# Patient Record
Sex: Female | Born: 1935 | Race: White | Hispanic: No | Marital: Married | Smoking: Never smoker
Health system: Southern US, Community
[De-identification: ages and names within clinical notes are randomized; demographics above are authoritative.]

## PROBLEM LIST (undated history)

## (undated) DIAGNOSIS — F419 Anxiety disorder, unspecified: Secondary | ICD-10-CM

## (undated) DIAGNOSIS — I10 Essential (primary) hypertension: Secondary | ICD-10-CM

## (undated) DIAGNOSIS — D649 Anemia, unspecified: Secondary | ICD-10-CM

## (undated) DIAGNOSIS — F32A Depression, unspecified: Secondary | ICD-10-CM

## (undated) DIAGNOSIS — E785 Hyperlipidemia, unspecified: Secondary | ICD-10-CM

## (undated) DIAGNOSIS — I00 Rheumatic fever without heart involvement: Secondary | ICD-10-CM

## (undated) DIAGNOSIS — E039 Hypothyroidism, unspecified: Secondary | ICD-10-CM

## (undated) DIAGNOSIS — F329 Major depressive disorder, single episode, unspecified: Secondary | ICD-10-CM

## (undated) DIAGNOSIS — R7302 Impaired glucose tolerance (oral): Secondary | ICD-10-CM

## (undated) DIAGNOSIS — K219 Gastro-esophageal reflux disease without esophagitis: Secondary | ICD-10-CM

## (undated) DIAGNOSIS — Z8619 Personal history of other infectious and parasitic diseases: Secondary | ICD-10-CM

## (undated) DIAGNOSIS — K5792 Diverticulitis of intestine, part unspecified, without perforation or abscess without bleeding: Secondary | ICD-10-CM

## (undated) HISTORY — DX: Impaired glucose tolerance (oral): R73.02

## (undated) HISTORY — DX: Rheumatic fever without heart involvement: I00

## (undated) HISTORY — PX: ABDOMINAL HYSTERECTOMY: SHX81

## (undated) HISTORY — DX: Anxiety disorder, unspecified: F41.9

## (undated) HISTORY — DX: Hypothyroidism, unspecified: E03.9

## (undated) HISTORY — DX: Hyperlipidemia, unspecified: E78.5

## (undated) HISTORY — DX: Essential (primary) hypertension: I10

## (undated) HISTORY — DX: Anemia, unspecified: D64.9

## (undated) HISTORY — DX: Gastro-esophageal reflux disease without esophagitis: K21.9

## (undated) HISTORY — DX: Personal history of other infectious and parasitic diseases: Z86.19

## (undated) HISTORY — DX: Diverticulitis of intestine, part unspecified, without perforation or abscess without bleeding: K57.92

---

## 1989-05-27 HISTORY — PX: ABDOMINAL HYSTERECTOMY: SUR658

## 1997-11-07 ENCOUNTER — Other Ambulatory Visit: Admission: RE | Admit: 1997-11-07 | Discharge: 1997-11-07 | Payer: Self-pay | Admitting: Family Medicine

## 1997-11-10 ENCOUNTER — Other Ambulatory Visit: Admission: RE | Admit: 1997-11-10 | Discharge: 1997-11-10 | Payer: Self-pay | Admitting: Family Medicine

## 1998-12-15 ENCOUNTER — Other Ambulatory Visit: Admission: RE | Admit: 1998-12-15 | Discharge: 1998-12-15 | Payer: Self-pay | Admitting: Family Medicine

## 1999-07-19 LAB — CONVERTED CEMR LAB: Pap Smear: NORMAL

## 1999-12-24 ENCOUNTER — Other Ambulatory Visit: Admission: RE | Admit: 1999-12-24 | Discharge: 1999-12-24 | Payer: Self-pay | Admitting: Family Medicine

## 2001-04-08 ENCOUNTER — Encounter (INDEPENDENT_AMBULATORY_CARE_PROVIDER_SITE_OTHER): Payer: Self-pay | Admitting: *Deleted

## 2003-02-02 ENCOUNTER — Encounter (INDEPENDENT_AMBULATORY_CARE_PROVIDER_SITE_OTHER): Payer: Self-pay | Admitting: *Deleted

## 2007-02-27 ENCOUNTER — Encounter (INDEPENDENT_AMBULATORY_CARE_PROVIDER_SITE_OTHER): Payer: Self-pay | Admitting: *Deleted

## 2007-02-28 ENCOUNTER — Encounter (INDEPENDENT_AMBULATORY_CARE_PROVIDER_SITE_OTHER): Payer: Self-pay | Admitting: *Deleted

## 2008-02-24 ENCOUNTER — Encounter (INDEPENDENT_AMBULATORY_CARE_PROVIDER_SITE_OTHER): Payer: Self-pay | Admitting: *Deleted

## 2008-03-02 ENCOUNTER — Encounter (INDEPENDENT_AMBULATORY_CARE_PROVIDER_SITE_OTHER): Payer: Self-pay | Admitting: *Deleted

## 2008-07-20 ENCOUNTER — Ambulatory Visit: Payer: Self-pay | Admitting: *Deleted

## 2008-07-20 DIAGNOSIS — K5732 Diverticulitis of large intestine without perforation or abscess without bleeding: Secondary | ICD-10-CM | POA: Insufficient documentation

## 2008-07-20 DIAGNOSIS — E785 Hyperlipidemia, unspecified: Secondary | ICD-10-CM | POA: Insufficient documentation

## 2008-07-20 DIAGNOSIS — K219 Gastro-esophageal reflux disease without esophagitis: Secondary | ICD-10-CM | POA: Insufficient documentation

## 2008-07-20 DIAGNOSIS — F329 Major depressive disorder, single episode, unspecified: Secondary | ICD-10-CM | POA: Insufficient documentation

## 2008-07-20 DIAGNOSIS — E039 Hypothyroidism, unspecified: Secondary | ICD-10-CM | POA: Insufficient documentation

## 2008-07-20 DIAGNOSIS — B019 Varicella without complication: Secondary | ICD-10-CM | POA: Insufficient documentation

## 2008-07-20 DIAGNOSIS — I00 Rheumatic fever without heart involvement: Secondary | ICD-10-CM | POA: Insufficient documentation

## 2008-07-20 DIAGNOSIS — M810 Age-related osteoporosis without current pathological fracture: Secondary | ICD-10-CM | POA: Insufficient documentation

## 2008-07-20 DIAGNOSIS — F411 Generalized anxiety disorder: Secondary | ICD-10-CM | POA: Insufficient documentation

## 2008-07-24 DIAGNOSIS — I10 Essential (primary) hypertension: Secondary | ICD-10-CM | POA: Insufficient documentation

## 2008-07-28 DIAGNOSIS — E871 Hypo-osmolality and hyponatremia: Secondary | ICD-10-CM | POA: Insufficient documentation

## 2008-07-28 DIAGNOSIS — E739 Lactose intolerance, unspecified: Secondary | ICD-10-CM | POA: Insufficient documentation

## 2008-07-28 DIAGNOSIS — D509 Iron deficiency anemia, unspecified: Secondary | ICD-10-CM | POA: Insufficient documentation

## 2008-07-28 DIAGNOSIS — R748 Abnormal levels of other serum enzymes: Secondary | ICD-10-CM | POA: Insufficient documentation

## 2008-08-17 ENCOUNTER — Ambulatory Visit: Payer: Self-pay | Admitting: *Deleted

## 2008-11-29 ENCOUNTER — Ambulatory Visit: Payer: Self-pay | Admitting: Family Medicine

## 2008-11-30 ENCOUNTER — Encounter: Payer: Self-pay | Admitting: Family Medicine

## 2008-12-01 LAB — CONVERTED CEMR LAB
AST: 25 units/L (ref 0–37)
BUN: 26 mg/dL — ABNORMAL HIGH (ref 6–23)
CO2: 18 meq/L — ABNORMAL LOW (ref 19–32)
Calcium: 9.5 mg/dL (ref 8.4–10.5)
Chloride: 104 meq/L (ref 96–112)
Cholesterol: 206 mg/dL — ABNORMAL HIGH (ref 0–200)
Creatinine, Ser: 0.92 mg/dL (ref 0.40–1.20)
HCT: 44.1 % (ref 36.0–46.0)
HDL: 51 mg/dL (ref >39)
Hemoglobin: 14.2 g/dL (ref 12.0–15.0)
MCV: 96.7 fL (ref 78.0–100.0)
RDW: 15.4 % (ref 11.5–15.5)
Total CHOL/HDL Ratio: 4

## 2008-12-13 ENCOUNTER — Telehealth: Payer: Self-pay | Admitting: Family Medicine

## 2008-12-13 DIAGNOSIS — R7301 Impaired fasting glucose: Secondary | ICD-10-CM

## 2009-03-01 ENCOUNTER — Encounter: Payer: Self-pay | Admitting: Internal Medicine

## 2009-03-01 LAB — HM MAMMOGRAPHY: HM Mammogram: NORMAL

## 2009-03-08 ENCOUNTER — Telehealth: Payer: Self-pay | Admitting: Internal Medicine

## 2009-03-13 ENCOUNTER — Encounter: Payer: Self-pay | Admitting: Internal Medicine

## 2009-04-03 ENCOUNTER — Telehealth (INDEPENDENT_AMBULATORY_CARE_PROVIDER_SITE_OTHER): Payer: Self-pay | Admitting: *Deleted

## 2009-04-05 ENCOUNTER — Telehealth (INDEPENDENT_AMBULATORY_CARE_PROVIDER_SITE_OTHER): Payer: Self-pay | Admitting: *Deleted

## 2009-06-13 ENCOUNTER — Ambulatory Visit: Payer: Self-pay | Admitting: Family

## 2009-06-13 LAB — CONVERTED CEMR LAB
ALT: 18 units/L (ref 0–35)
Alkaline Phosphatase: 111 units/L (ref 39–117)
BUN: 17 mg/dL (ref 6–23)
Basophils Absolute: 0 10*3/uL (ref 0.0–0.1)
Bilirubin, Direct: 0.1 mg/dL (ref 0.0–0.3)
Creatinine, Ser: 0.9 mg/dL (ref 0.4–1.2)
GFR calc non Af Amer: 62.73 mL/min (ref >60)
Lymphocytes Relative: 20.6 % (ref 12.0–46.0)
Lymphs Abs: 1.4 10*3/uL (ref 0.7–4.0)
Monocytes Relative: 9 % (ref 3.0–12.0)
Platelets: 337 10*3/uL (ref 150.0–400.0)
RDW: 13.4 % (ref 11.5–14.6)
TSH: 2.48 microintl units/mL (ref 0.35–5.50)
Total Protein: 7.8 g/dL (ref 6.0–8.3)

## 2009-06-15 ENCOUNTER — Encounter: Payer: Self-pay | Admitting: Family

## 2009-06-15 ENCOUNTER — Telehealth: Payer: Self-pay | Admitting: Family

## 2009-06-15 DIAGNOSIS — E875 Hyperkalemia: Secondary | ICD-10-CM

## 2009-07-05 ENCOUNTER — Ambulatory Visit: Payer: Self-pay | Admitting: Family

## 2009-07-10 LAB — CONVERTED CEMR LAB
CO2: 25 meq/L (ref 19–32)
Calcium: 9.2 mg/dL (ref 8.4–10.5)
Chloride: 108 meq/L (ref 96–112)
Sodium: 139 meq/L (ref 135–145)

## 2009-07-19 ENCOUNTER — Ambulatory Visit: Payer: Self-pay | Admitting: Family

## 2009-07-19 LAB — CONVERTED CEMR LAB
HDL: 50.4 mg/dL (ref >39.00)
Total CHOL/HDL Ratio: 4
Triglycerides: 155 mg/dL — ABNORMAL HIGH (ref 0.0–149.0)

## 2009-09-25 ENCOUNTER — Telehealth (INDEPENDENT_AMBULATORY_CARE_PROVIDER_SITE_OTHER): Payer: Self-pay | Admitting: *Deleted

## 2009-10-18 ENCOUNTER — Ambulatory Visit: Payer: Self-pay | Admitting: Family Medicine

## 2010-01-24 ENCOUNTER — Ambulatory Visit: Payer: Self-pay | Admitting: Family Medicine

## 2010-01-25 LAB — CONVERTED CEMR LAB
ALT: 16 units/L (ref 0–35)
Alkaline Phosphatase: 91 units/L (ref 39–117)
BUN: 19 mg/dL (ref 6–23)
Basophils Absolute: 0 10*3/uL (ref 0.0–0.1)
Bilirubin, Direct: 0.2 mg/dL (ref 0.0–0.3)
CO2: 27 meq/L (ref 19–32)
Chloride: 105 meq/L (ref 96–112)
Eosinophils Absolute: 0.1 10*3/uL (ref 0.0–0.7)
GFR calc non Af Amer: 78.52 mL/min (ref >60)
Glucose, Bld: 88 mg/dL (ref 70–99)
HCT: 40.3 % (ref 36.0–46.0)
Lymphs Abs: 1.4 10*3/uL (ref 0.7–4.0)
MCHC: 34.1 g/dL (ref 30.0–36.0)
MCV: 94.6 fL (ref 78.0–100.0)
Monocytes Absolute: 0.5 10*3/uL (ref 0.1–1.0)
Platelets: 283 10*3/uL (ref 150.0–400.0)
Potassium: 4.4 meq/L (ref 3.5–5.1)
RDW: 14.8 % — ABNORMAL HIGH (ref 11.5–14.6)
TSH: 2.9 microintl units/mL (ref 0.35–5.50)
Total Bilirubin: 1.1 mg/dL (ref 0.3–1.2)
Total Protein: 7 g/dL (ref 6.0–8.3)

## 2010-04-26 ENCOUNTER — Encounter: Payer: Self-pay | Admitting: Family Medicine

## 2010-04-26 ENCOUNTER — Telehealth (INDEPENDENT_AMBULATORY_CARE_PROVIDER_SITE_OTHER): Payer: Self-pay | Admitting: *Deleted

## 2010-05-29 ENCOUNTER — Encounter: Payer: Self-pay | Admitting: Family Medicine

## 2010-06-03 ENCOUNTER — Encounter: Payer: Self-pay | Admitting: Family Medicine

## 2010-06-26 NOTE — Assessment & Plan Note (Signed)
Summary: NEW TO ESTABLISH /OLD DR. Andrey Campanile   Vital Signs:  Patient profile:   77 year old female Height:      63 inches Weight:      110.6 pounds Pulse rate:   64 / minute BP sitting:   100 / 60  (left arm)  Vitals Entered By: Doristine Devoid (June 13, 2009 9:11 AM) CC: new est- refill on meds  Comments    Primary Care Provider:  Seymour Bars DO  CC:  new est- refill on meds .  History of Present Illness: Veronica Mckenzie is an 77 year old female who presents today for follow up.  She was seen last visit for follow up of her hyperglycemia.  Her A1C in May was 5.7.   Her TSH was also normal at that time.  Today her BP is 100/60 on amlodipine 10mg .  Patient agreeable to Td-last one greater than 10 years ago.  Allergies: 1)  ! 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   4 children Never Smoked Alcohol use-no  Risk Factors: Caffeine Use: 1 (07/20/2008) Exercise: no (07/20/2008)  Risk Factors: Smoking Status: never (07/20/2008) Passive Smoke Exposure: no (07/20/2008)  Family History: Reviewed history from 07/20/2008 and no changes required. Family History of Arthritis Family History Hypertension Family History of  CAD  Social History: Reviewed history from 11/29/2008 and no changes required. 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   4 children Never Smoked Alcohol  use-no  Review of Systems       Notes nocturia x 1 at night.   Denies polydypsia.  Daughter notes overall not a good appetite.    Physical Exam  General:  Thin elderly female, in no acute distress; alert,appropriate and cooperative throughout examination Head:  Normocephalic and atraumatic without obvious abnormalities. No apparent alopecia or balding. Eyes:  No injection Ears:  External ear exam shows no significant lesions or deformities.  Otoscopic examination reveals clear canals, tympanic membranes are intact bilaterally without bulging, retraction, inflammation or discharge. Hearing is grossly normal bilaterally. Mouth:  Oral mucosa and oropharynx without lesions or exudates.  Teeth in good repair. Neck:  No deformities, masses, or tenderness noted. Lungs:  Normal respiratory effort, chest expands symmetrically. Lungs are clear to auscultation, no crackles or wheezes. Heart:  Normal rate and regular rhythm. S1 and S2 normal without gallop, murmur, click, rub or other extra sounds. Abdomen:  Bowel sounds positive,abdomen soft and non-tender without masses, organomegaly or hernias noted. Msk:  MAE Extremities:  trace left pedal edema and trace right pedal edema.   Neurologic:  strength normal in all extremities.   Skin:  Intact without suspicious lesions or rashes Cervical Nodes:  No lymphadenopathy noted Psych:  Cognition and judgment appear intact. Alert and cooperative with normal attention span and concentration. No apparent delusions, illusions, hallucinations   Impression & Recommendations:  Problem # 1:  IMPAIRED FASTING GLUCOSE (ICD-790.21) Assessment New Will check A1C today, was stable last visit.  Patient's daughter continues to help her with her meal choices. Orders:  Venipuncture ([address]) TLB-BMP (Basic Metabolic Panel-BMET) (80048-METABOL)  Problem # 2:  ANXIETY (ICD-300.00) Assessment: Improved Patient reports that this is well controlled on paroxetine, will  continue the same. Her updated medication list for this problem includes:    Paroxetine Hcl 10 Mg Tabs (Paroxetine hcl) ..... One tab by mouth once daily  Problem # 3:  OSTEOPOROSIS (ICD-733.00) Assessment: Comment Only declines dexascan, plan to continue boniva, will also add Caltrate +D to her regimen.  Her updated medication list for this problem includes:    Boniva 150 Mg Tabs (Ibandronate sodium) .Marland Kitchen... Take one tablet once monthly with 8 to 10 ounces of water. stay upright and do not eat or drink for 1 hours.  Problem # 4:  ANEMIA, IRON DEFICIENCY (ICD-280.9) Assessment: Comment Only Will check CBC to monitor. Orders: TLB-CBC Platelet - w/Differential (85025-CBCD)  Problem # 5:  Preventive Health Care (ICD-V70.0) Declines dexa scan, s/p TAH,  agreeable to bimanual exam next visit. (still has ovaries)  Last mammogram 10/10- normal, had flu shot  Problem # 6:  HYPERTENSION (ICD-401.9) Assessment: Comment Only Appears overtreated- will decrease amlodipine to 5 mg.  Plan follow up BP check n 1 month. Her updated medication list for this problem includes:    Norvasc 5 Mg Tabs (Amlodipine besylate) ..... One tablet by mouth daily  BP today: 100/60 Prior BP: 153/77 (11/29/2008)  Labs Reviewed: K+: 4.6 (11/30/2008) Creat: : 0.92 (11/30/2008)   Chol: 206 (11/30/2008)   HDL: 51 (11/30/2008)   LDL: 106 (11/30/2008)   TG: 243 (11/30/2008)  Problem # 7:  HYPERLIPIDEMIA (ICD-272.4) Assessment: Comment Only Plan to have patient perform FLP when fasting  Complete Medication List: 1)  Boniva 150 Mg Tabs (Ibandronate sodium) .... Take one tablet once monthly with 8 to 10 ounces of water. stay upright and do not eat or drink for 1 hours. 2)  Norvasc 5 Mg Tabs (Amlodipine besylate) .... One tablet by mouth daily 3)  Synthroid 50 Mcg Tabs (Levothyroxine sodium) .... Take 1 tablet by mouth once a day 4)  Paroxetine Hcl 10 Mg Tabs (Paroxetine hcl) .... One tab by mouth once daily 5)  Caltrate  600+d 600-400 Mg-unit Tabs (Calcium carbonate-vitamin d) .... One tablet by mouth bid  Other Orders: TLB-TSH (Thyroid Stimulating Hormone) (84443-TSH) TLB-Hepatic/Liver Function Pnl (80076-HEPATIC) TD Toxoids IM 7 YR + ([address]) Admin 1st Vaccine (098[address])  Patient Instructions: 1)  Please return fasting for a visit in 1 month. 2)  Complete your lab work prior to leaving. Prescriptions: PAROXETINE HCL 10 MG TABS (PAROXETINE HCL) one tab by mouth once daily  #30 x 2   Entered and Authorized by:   Lemont Fillers FNP   Signed by:   Lemont Fillers FNP on 06/13/2009   Method used:   Electronically to        [facility] [phone]* (retail)       [address]       [place], [place]  [address]       Ph: [phone]       Fax: [phone]   RxID:   [id number] SYNTHROID 50 MCG TABS (LEVOTHYROXINE SODIUM) Take 1 tablet by mouth once a day  #30 x 2   Entered and Authorized by:   Lemont Fillers FNP   Signed by:   Lemont Fillers FNP on 06/13/2009   Method used:   Electronically to        [facility] [phone]* (retail)  [address]       [place], [place]  [address]       Ph: [phone]       Fax: [phone]   RxID:   [id number] BONIVA 150 MG TABS (IBANDRONATE SODIUM) take one tablet once monthly with 8 to 10 ounces of water. Stay upright and do not eat or drink for 1 hours.  #1 x 2   Entered and Authorized by:   Lemont Fillers FNP   Signed by:   Lemont Fillers FNP on 06/13/2009   Method used:   Electronically to        [facility] [phone]* (retail)       [address]       [place], [place]  [address]       Ph: [phone]       Fax: [phone]   RxID:   [id number] NORVASC 5 MG TABS (AMLODIPINE BESYLATE) one tablet by mouth daily  #30 x 1   Entered and Authorized by:   Lemont Fillers FNP   Signed by:   Lemont Fillers FNP on 06/13/2009   Method used:   Electronically to        [facility] [phone]*  (retail)       [address]       [place], [place]  [address]       Ph: [phone]       Fax: [phone]   RxID:   [id number]    Preventive Care Screening     -has been over 10 yrs since last colon but daughter not sure she can repeat.    Immunization History:  Influenza Immunization History:    Influenza:  historical (02/24/2009)  Immunizations Administered:  Tetanus Vaccine:    Vaccine Type: Td    Site: left deltoid    Mfr: Sanofi Pasteur    Dose: 0.5 ml    Route: IM    Given by: Doristine Devoid    Exp. Date: 07/29/2010    Lot #: [id number]   Prevention & Chronic Care Immunizations   Influenza vaccine: Historical  (02/24/2009)    Tetanus booster: 06/13/2009: Td    Pneumococcal vaccine: Not documented    H. zoster vaccine: Not documented  Colorectal Screening   Hemoccult: Not documented    Colonoscopy: Not documented  Other Screening   Pap smear: Normal  (07/19/1999)    Mammogram: normal  (03/01/2009)    DXA bone density scan: Not documented   Smoking status: never  (07/20/2008)  Lipids   Total Cholesterol: 206  (11/30/2008)   LDL: 106  (11/30/2008)   LDL Direct: Not documented   HDL: 51  (11/30/2008)   Triglycerides: 243  (11/30/2008)    SGOT (AST): 25  (11/30/2008)   SGPT (ALT): 18  (11/30/2008)   Alkaline phosphatase: 101  (11/30/2008)   Total bilirubin: 0.7  (11/30/2008)  Hypertension   Last Blood Pressure: 100 / 60  (06/13/2009)   Serum creatinine: 0.92  (11/30/2008)   Serum potassium 4.6  (11/30/2008)  Self-Management Support :    Hypertension self-management support: Not documented    Lipid self-management support: Not documented

## 2010-06-26 NOTE — Progress Notes (Signed)
Summary: Lab Results  Phone Note Outgoing Call   Summary of Call: Would you please call Veronica Mckenzie and ask her to return for a BMET in two weeks (ICD-9 276.7) Her potassium is a little high. Thanks  Initial call taken by: Lemont Fillers FNP,  June 15, 2009 3:49 PM  Follow-up for Phone Call        Left message on machine informing patient potassium is slightly elevated and she needs to call to schedule a non-fasting lab appointment.  Copy of labs mailed Follow-up by: Shonna Chock,  June 16, 2009 10:55 AM  New Problems: HYPERKALEMIA (ICD-276.7)   New Problems: HYPERKALEMIA (ICD-276.7)

## 2010-06-26 NOTE — Assessment & Plan Note (Signed)
Summary: rto 3 months/fasting/cbs   Vital Signs:  Patient profile:   77 year old female Weight:      112 pounds Pulse rate:   92 / minute BP sitting:   118 / 80  (left arm)  Vitals Entered By: Doristine Devoid CMA (January 24, 2010 8:49 AM) CC: 3 month roa and labs    History of Present Illness: 77 yo woman here today for f/u on  1) Hyperlipidemia- hx of elevated cholesterol but not on meds.  due for labs.  2) Glucose intolerance- still eating well, daughter is monitoring intake.  riding stationary bike.  3) Hypothyroid- taking 50 micrograms daily.  denies cold intolerance, heat intolerance.  denies brittle hair or nails.  4) HTN- BP excellently controlled on Norvasc 5 mg.  no CP, SOB, HAs, visual changes, edema.  Preventive Screening-Counseling & Management  Alcohol-Tobacco     Alcohol drinks/day: 0     Smoking Status: never  Caffeine-Diet-Exercise     Does Patient Exercise: yes     Type of exercise: stationary bike      Drug Use:  never.    Problems Prior to Update: 1)  Hyperkalemia  (ICD-276.7) 2)  Impaired Fasting Glucose  (ICD-790.21) 3)  Anemia, Iron Deficiency  (ICD-280.9) 4)  Alkaline Phosphatase, Elevated  (ICD-790.5) 5)  Hyponatremia  (ICD-276.1) 6)  Glucose Intolerance  (ICD-271.3) 7)  Osteoporosis  (ICD-733.00) 8)  Hypertension  (ICD-401.9) 9)  Gerd  (ICD-530.81) 10)  Hyperlipidemia  (ICD-272.4) 11)  Anxiety  (ICD-300.00) 12)  Hypothyroidism  (ICD-244.9) 13)  Rheumatic Fever  (ICD-390) 14)  Diverticulitis, Colon  (ICD-562.11) 15)  Depression  (ICD-311) 16)  Chickenpox  (ICD-052.9)  Current Medications (verified): 1)  Boniva 150 Mg Tabs (Ibandronate Sodium) .... Take One Tablet Once Monthly With 8 To 10 Ounces of Water. Stay Upright and Do Not Eat or Drink For 1 Hours. 2)  Norvasc 5 Mg Tabs (Amlodipine Besylate) .... One Tablet By Mouth Daily 3)  Synthroid 50 Mcg Tabs (Levothyroxine Sodium) .... Take 1 Tablet By Mouth Once A Day 4)  Paroxetine Hcl 10  Mg Tabs (Paroxetine Hcl) .... One Tab By Mouth Once Daily 5)  Caltrate 600+d 600-400 Mg-Unit Tabs (Calcium Carbonate-Vitamin D) .... One Tablet By Mouth Bid 6)  Ativan 0.5 Mg Tabs (Lorazepam) .... One Tablet By Mouth Two Times A Day As Needed For Severe Anxiety  Allergies (verified): 1)  ! 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  has life line 4 children Never Smoked Alcohol use-no Does Patient Exercise:  yes Drug Use:  never  Review of Systems      See HPI  Physical Exam  General:  Well-developed,well-nourished,in no acute distress; alert,appropriate and cooperative throughout examination Head:  Normocephalic and atraumatic without obvious abnormalities. No apparent alopecia or balding. Eyes:  PERRL, EOMI Neck:  No deformities, masses, or tenderness noted. Lungs:  Normal respiratory effort, chest expands symmetrically. Lungs are clear to auscultation, no crackles or wheezes. Heart:  Normal rate and regular rhythm. S1 and S2 normal without gallop, murmur, click, rub or other extra sounds. Pulses:  2+ radial pulses, PT pulses Extremities:  No clubbing, cyanosis, edema Neurologic:  alert & oriented X3 and cranial nerves II-XII intact.   Psych:  Cognition and judgment appear intact. Alert and cooperative with normal attention span and concentration. No apparent delusions, illusions, hallucinations   Impression & Recommendations:  Problem # 1:  HYPERLIPIDEMIA (ICD-272.4) Assessment Unchanged due for labs.  discussed w/ pt and family- even if pt's cholesterol is mildly elevated will likely not start statin given pt's age. Orders: TLB-Lipid Panel (80061-LIPID) TLB-Hepatic/Liver Function Pnl  (80076-HEPATIC) Specimen Handling (04540)  Problem # 2:  HYPOTHYROIDISM (ICD-244.9) Assessment: Unchanged due for labs.  asymptomatic.  adjust meds as needed. Her updated medication list for this problem includes:    Synthroid 50 Mcg Tabs (Levothyroxine sodium) .Marland Kitchen... Take 1 tablet by mouth once a day  Orders: TLB-TSH (Thyroid Stimulating Hormone) (84443-TSH) Specimen Handling (98119)  Problem # 3:  GLUCOSE INTOLERANCE (ICD-271.3) Assessment: Unchanged check labs.  pt exercising and watching diet.  will follow. Orders: Venipuncture ([address]) TLB-A1C / Hgb A1C (Glycohemoglobin) (83036-A1C) Specimen Handling ([address])  Problem # 4:  HYPERTENSION (ICD-401.9) Assessment: Unchanged BP excellently controlled.  no changes at this time. Her updated medication list for this problem includes:    Norvasc 5 Mg Tabs (Amlodipine besylate) ..... One tablet by mouth daily  Orders: TLB-BMP (Basic Metabolic Panel-BMET) (80048-METABOL) TLB-CBC Platelet - w/Differential (85025-CBCD) Specimen Handling ([address])  Problem # 5:  OSTEOPOROSIS (ICD-733.00) Assessment: Unchanged check Vit D.  overdue for Dexa.  pt's daughter would prefer pt have Dexa next Oct when she gets mammogram.  in agreement w/ plan. Her updated medication list for this problem includes:    Boniva 150 Mg Tabs (Ibandronate sodium) .Marland Kitchen... Take one tablet once monthly with 8 to 10 ounces of water. stay upright and do not eat or drink for 1 hours.    Caltrate 600+d 600-400 Mg-unit Tabs (Calcium carbonate-vitamin d) ..... One tablet by mouth bid  Orders: T-Vitamin D (25-Hydroxy) (78469-62952) Specimen Handling ([address])  Complete Medication List: 1)  Boniva 150 Mg Tabs (Ibandronate sodium) .... Take one tablet once monthly with 8 to 10 ounces of water. stay upright and do not eat or drink for 1 hours. 2)  Norvasc 5 Mg Tabs (Amlodipine besylate) .... One tablet by mouth daily 3)  Synthroid 50 Mcg Tabs (Levothyroxine sodium) .... Take 1  tablet by mouth once a day 4)  Paroxetine Hcl 10 Mg Tabs (Paroxetine hcl) .... One tab by mouth once daily 5)  Caltrate 600+d 600-400 Mg-unit Tabs (Calcium carbonate-vitamin d) .... One tablet by mouth bid 6)  Ativan 0.5 Mg Tabs (Lorazepam) .... One tablet by mouth two times a day as needed for severe anxiety  Patient Instructions: 1)  Please schedule a follow-up appointment in 1 year or as you need me. 2)  We'll notify you of your lab results 3)  Call with any questions or concerns 4)  Keep up the good work- you look great! 5)  Happy Labor Day!

## 2010-06-26 NOTE — Progress Notes (Signed)
  Phone Note Call from Patient   Caller: Veronica Mckenzie  [phone] Summary of Call: mother is at stage where he feels she needs [employer] product. Pt was seen by [name], wants to be Dr [name] patient since she is back --Informed son pt is  due for a 3 month followup appt from 07/19/09, offered appt.  son says will have sister call and schedule.[name]  Sep 25, 2009 11:01 AM  Initial call taken by: [name],  Sep 25, 2009 11:01 AM

## 2010-06-26 NOTE — Assessment & Plan Note (Signed)
Summary: ONE MTH FU/NS/FASTING/KDC   Vital Signs:  Patient profile:   77 year old female Weight:      112 pounds Pulse rate:   66 / minute BP sitting:   142 / 70  (left arm)  Vitals Entered By: Doristine Devoid (July 19, 2009 8:33 AM) CC: 1 month roa and labs    Primary Care Provider:  Seymour Bars DO  CC:  1 month roa and labs .  History of Present Illness: Veronica Mckenzie is an 77 year old female who presents today for follow up of her BP and for fasting labs. Daughter notes that patient has occasional panic attacks- these are often accompanied by incontinence.  They generally only occur prior to office visits, or if something is "out of the ordinary."    Allergies: 1)  ! * Maxide  Physical Exam  General:  Well-developed,well-nourished,in no acute distress; alert,appropriate and cooperative throughout examination Head:  Normocephalic and atraumatic without obvious abnormalities. No apparent alopecia or balding. Lungs:  Normal respiratory effort, chest expands symmetrically. Lungs are clear to auscultation, no crackles or wheezes. Heart:  Normal rate and regular rhythm. S1 and S2 normal without gallop, murmur, click, rub or other extra sounds. Genitalia:  declines GYN examination. Extremities:  No clubbing, cyanosis, edema, or deformity noted with normal full range of motion of all joints.     Impression & Recommendations:  Problem # 1:  HYPERKALEMIA (ICD-276.7) Assessment Improved F/u BMET was normal  Problem # 2:  HYPERTENSION (ICD-401.9) Assessment: Comment Only BP is slighty elevated, however patient has not taken AM meds-  will continue current dose of norvasc.   Her updated medication list for this problem includes:    Norvasc 5 Mg Tabs (Amlodipine besylate) ..... One tablet by mouth daily  BP today: 142/70 Prior BP: 100/60 (06/13/2009)  Labs Reviewed: K+: 3.7 (07/05/2009) Creat: : 1.0 (07/05/2009)   Chol: 206 (11/30/2008)   HDL: 51 (11/30/2008)   LDL: 106  (11/30/2008)   TG: 243 (11/30/2008)  Problem # 3:  ANXIETY (ICD-300.00) Assessment: Comment Only Daughter notes rare panic attacks which are often accompanied by fecal incontinence.  Patient had episode with fecal incontinence here in clinic.  Will add as needed ativan.   Her updated medication list for this problem includes:    Paroxetine Hcl 10 Mg Tabs (Paroxetine hcl) ..... One tab by mouth once daily    Ativan 0.5 Mg Tabs (Lorazepam) ..... One tablet by mouth two times a day as needed for severe anxiety  Problem # 4:  IMPAIRED FASTING GLUCOSE (ICD-790.21) Assessment: Comment Only  Orders: TLB-A1C / Hgb A1C (Glycohemoglobin) (83036-A1C)  Complete Medication List: 1)  Boniva 150 Mg Tabs (Ibandronate sodium) .... Take one tablet once monthly with 8 to 10 ounces of water. stay upright and do not eat or drink for 1 hours. 2)  Norvasc 5 Mg Tabs (Amlodipine besylate) .... One tablet by mouth daily 3)  Synthroid 50 Mcg Tabs (Levothyroxine sodium) .... Take 1 tablet by mouth once a day 4)  Paroxetine Hcl 10 Mg Tabs (Paroxetine hcl) .... One tab by mouth once daily 5)  Caltrate 600+d 600-400 Mg-unit Tabs (Calcium carbonate-vitamin d) .... One tablet by mouth bid 6)  Ativan 0.5 Mg Tabs (Lorazepam) .... One tablet by mouth two times a day as needed for severe anxiety  Other Orders: Venipuncture ([address]) TLB-Lipid Panel (80061-LIPID)  Patient Instructions: 1)  Please complete your lab work prior to leaving today. 2)  Please schedule a follow-up appointment  in 3 months. Prescriptions: PAROXETINE HCL 10 MG TABS (PAROXETINE HCL) one tab by mouth once daily  #30 x 2   Entered and Authorized by:   Lemont Fillers FNP   Signed by:   Lemont Fillers FNP on 07/19/2009   Method used:   Electronically to        [facility] [phone]* (retail)       [address]       [place], [place]  [address]       Ph: [phone]       Fax: [phone]   RxID:   [id number] SYNTHROID 50 MCG  TABS (LEVOTHYROXINE SODIUM) Take 1 tablet by mouth once a day  #30 x 2   Entered and Authorized by:   Lemont Fillers FNP   Signed by:   Lemont Fillers FNP on 07/19/2009   Method used:   Electronically to        [facility] [phone]* (retail)       [address]       [place], [place]  [address]       Ph: [phone]       Fax: [phone]   RxID:   [id number] NORVASC 5 MG TABS (AMLODIPINE BESYLATE) one tablet by mouth daily  #30 x 2   Entered and Authorized by:   Lemont Fillers FNP   Signed by:   Lemont Fillers FNP on 07/19/2009   Method used:   Electronically to        [facility] [phone]* (retail)       [address]       [place], [place]  [address]       Ph: [phone]       Fax: [phone]   RxID:   [id number] ATIVAN 0.5 MG TABS (LORAZEPAM) one tablet by mouth two times a day as needed for severe anxiety  #30 x 0   Entered and Authorized by:   Lemont Fillers FNP   Signed by:   Lemont Fillers FNP on 07/19/2009   Method used:   Print then Give to Patient   RxID:   [id number]    Immunization History:  Pneumovax Immunization History:    Pneumovax:  historical (03/27/2002)

## 2010-06-26 NOTE — Letter (Signed)
   [facility] [address] [place], [place] [address] [phone]    June 15, 2009   Maire Rosencrans [address] [place], [place] [address]  RE:  LAB RESULTS  Dear  Ms. Gallier,  The following is an interpretation of your most recent lab tests.  Please take note of any instructions provided or changes to medications that have resulted from your lab work.  ELECTROLYTES:  Abnormal - schedule a follow-up appointment   THYROID STUDIES:  Thyroid studies normal TSH: 2.48     DIABETIC STUDIES:  Excellent - no changes needed Blood Glucose: 58   HgbA1C: 5.7     CBC:  Good - no changes needed Your potassium is elevated please return in 2 weeks for a repeat blood test.    Please call my office to make arrangements for further tests or an appointment.  Sincerely Yours,    Lemont Fillers FNP

## 2010-06-26 NOTE — Progress Notes (Signed)
Summary: would prefer actonel-  Phone Note Refill Request Message from:  Fax from Pharmacy on April 26, 2010 3:50 PM  Refills Requested: Medication #1:  BONIVA 150 MG TABS take one tablet once monthly with 8 to 10 ounces of water. Stay upright and do not eat or drink for 1 hours. insurance would like for patient to try alternative medication medication and sent to pharmacy will also contact patient to inform.  Initial call taken by: Doristine Devoid CMA,  April 26, 2010 3:51 PM  Follow-up for Phone Call        spoke w/ patient daughter would like for her to take actonel if possbile since mom has GI issues and would prefer something once monthly ........Marland KitchenDoristine Devoid CMA  April 26, 2010 3:59 PM   Additional Follow-up for Phone Call Additional follow up Details #1::        this is fine if it is on approved list provided by insurance company Additional Follow-up by: Neena Rhymes MD,  April 26, 2010 4:31 PM    Additional Follow-up for Phone Call Additional follow up Details #2::    spoke w/ patient aware med changed and sent to pharmacy .......Marland KitchenDoristine Devoid CMA  April 26, 2010 4:52 PM   New/Updated Medications: ALENDRONATE SODIUM 70 MG TABS (ALENDRONATE SODIUM) take one tablet weekly ACTONEL 150 MG TABS (RISEDRONATE SODIUM) take one tablet monthly Prescriptions: ACTONEL 150 MG TABS (RISEDRONATE SODIUM) take one tablet monthly  #12 x 1   Entered by:   Doristine Devoid CMA   Authorized by:   Neena Rhymes MD   Signed by:   Doristine Devoid CMA on 04/26/2010   Method used:   Historical   RxID:   [id number]

## 2010-06-26 NOTE — Medication Information (Signed)
Summary: Formulary Letter Regarding Boniva/[employer]  Formulary Letter Regarding Boniva/[employer]   Imported By: Lanelle Bal 05/03/2010 13:06:53  _____________________________________________________________________  External Attachment:    Type:   Image     Comment:   External Document

## 2010-06-26 NOTE — Assessment & Plan Note (Signed)
Summary: 3 MTH FU/KDC   Vital Signs:  Patient profile:   77 year old female Height:      63 inches Weight:      111 pounds BMI:     19.73 Pulse rate:   82 / minute BP sitting:   126 / 70  (left arm)  Vitals Entered By: Doristine Devoid (Oct 18, 2009 9:02 AM) CC: 3 month roa and refill    History of Present Illness: 77 yo woman here today for  1) HTN- Well controlled.  tolerating meds w/out difficulty.  no CP, SOB, edema, HAs, visual changes.  2) Anxiety/Depression- under good control on Paxil.  has not had to use the Ativan.  3) Glucose intolerance- daughter is controlling what pt is eating, last A1C was 5.7.  4) Thyroid- last check was WNL, due for labs in 3 months.  Problems Prior to Update: 1)  Hyperkalemia  (ICD-276.7) 2)  Impaired Fasting Glucose  (ICD-790.21) 3)  Anemia, Iron Deficiency  (ICD-280.9) 4)  Alkaline Phosphatase, Elevated  (ICD-790.5) 5)  Hyponatremia  (ICD-276.1) 6)  Glucose Intolerance  (ICD-271.3) 7)  Osteoporosis  (ICD-733.00) 8)  Hypertension  (ICD-401.9) 9)  Gerd  (ICD-530.81) 10)  Hyperlipidemia  (ICD-272.4) 11)  Anxiety  (ICD-300.00) 12)  Hypothyroidism  (ICD-244.9) 13)  Rheumatic Fever  (ICD-390) 14)  Diverticulitis, Colon  (ICD-562.11) 15)  Depression  (ICD-311) 16)  Chickenpox  (ICD-052.9)  Current Medications (verified): 1)  Boniva 150 Mg Tabs (Ibandronate Sodium) .... Take One Tablet Once Monthly With 8 To 10 Ounces of Water. Stay Upright and Do Not Eat or Drink For 1 Hours. 2)  Norvasc 5 Mg Tabs (Amlodipine Besylate) .... One Tablet By Mouth Daily 3)  Synthroid 50 Mcg Tabs (Levothyroxine Sodium) .... Take 1 Tablet By Mouth Once A Day 4)  Paroxetine Hcl 10 Mg Tabs (Paroxetine Hcl) .... One Tab By Mouth Once Daily 5)  Caltrate 600+d 600-400 Mg-Unit Tabs (Calcium Carbonate-Vitamin D) .... One Tablet By Mouth Bid 6)  Ativan 0.5 Mg Tabs (Lorazepam) .... One Tablet By Mouth Two Times A Day As Needed For Severe Anxiety  Allergies  (verified): 1)  ! * Maxide  Past History:  Social History: Last updated: 06/13/2009 Retired: Catering manager Widowed lives alone,   doing well.   4 children Never Smoked Alcohol use-no  Review of Systems      See HPI  Physical Exam  General:  Well-developed,well-nourished,in no acute distress; alert,appropriate and cooperative throughout examination Neck:  No deformities, masses, or tenderness noted. Lungs:  Normal respiratory effort, chest expands symmetrically. Lungs are clear to auscultation, no crackles or wheezes. Heart:  Normal rate and regular rhythm. S1 and S2 normal without gallop, murmur, click, rub or other extra sounds. Abdomen:  Bowel sounds positive,abdomen soft and non-tender Pulses:  2+ radial pulses Extremities:  No clubbing, cyanosis, edema   Impression & Recommendations:  Problem # 1:  HYPERTENSION (ICD-401.9) Assessment Unchanged excellent control, asymptomatic.  no changes. Her updated medication list for this problem includes:    Norvasc 5 Mg Tabs (Amlodipine besylate) ..... One tablet by mouth daily  Problem # 2:  IMPAIRED FASTING GLUCOSE (ICD-790.21) Assessment: Unchanged pt's last A1C was WNL.  daughter is now controlling what pt eats.  will continue to monitor periodically but no need for meds at this time.  Problem # 3:  ANXIETY (ICD-300.00) Assessment: Unchanged pt's sxs well controlled on meds.  has not needed ativan. Her updated medication list for this problem includes:    Paroxetine  Hcl 10 Mg Tabs (Paroxetine hcl) ..... One tab by mouth once daily    Ativan 0.5 Mg Tabs (Lorazepam) ..... One tablet by mouth two times a day as needed for severe anxiety  Problem # 4:  HYPOTHYROIDISM (ICD-244.9) Assessment: Unchanged labs normal at last check.  asymptomatic.  no need for changes at this time. Her updated medication list for this problem includes:    Synthroid 50 Mcg Tabs (Levothyroxine sodium) .Marland Kitchen... Take 1 tablet by mouth once a day  Complete  Medication List: 1)  Boniva 150 Mg Tabs (Ibandronate sodium) .... Take one tablet once monthly with 8 to 10 ounces of water. stay upright and do not eat or drink for 1 hours. 2)  Norvasc 5 Mg Tabs (Amlodipine besylate) .... One tablet by mouth daily 3)  Synthroid 50 Mcg Tabs (Levothyroxine sodium) .... Take 1 tablet by mouth once a day 4)  Paroxetine Hcl 10 Mg Tabs (Paroxetine hcl) .... One tab by mouth once daily 5)  Caltrate 600+d 600-400 Mg-unit Tabs (Calcium carbonate-vitamin d) .... One tablet by mouth bid 6)  Ativan 0.5 Mg Tabs (Lorazepam) .... One tablet by mouth two times a day as needed for severe anxiety  Other Orders: Prescription Created Electronically [id number])  Patient Instructions: 1)  Please schedule a follow-up appointment in 3 months- do not eat before this appt so we can get labs. 2)  Keep up the good work!  You look great! 3)  Call with any questions or concerns 4)  No changes in medication at this time 5)  Have a great summer!  Prescriptions: BONIVA 150 MG TABS (IBANDRONATE SODIUM) take one tablet once monthly with 8 to 10 ounces of water. Stay upright and do not eat or drink for 1 hours.  #1 Tablet x 6   Entered and Authorized by:   Neena Rhymes MD   Signed by:   Neena Rhymes MD on 10/18/2009   Method used:   Electronically to        [facility] [phone]* (retail)       [address]       [place], [place]  [address]       Ph: [phone]       Fax: [phone]   RxID:   [id number] NORVASC 5 MG TABS (AMLODIPINE BESYLATE) one tablet by mouth daily  #30 x 6   Entered and Authorized by:   Neena Rhymes MD   Signed by:   Neena Rhymes MD on 10/18/2009   Method used:   Electronically to        [facility] [phone]* (retail)       [address]       [place], [place]  [address]       Ph: [phone]       Fax: [phone]   RxID:   [id number] PAROXETINE HCL 10 MG TABS (PAROXETINE HCL) one tab by mouth once daily  #30 x 6   Entered and  Authorized by:   Neena Rhymes MD   Signed by:   Neena Rhymes MD on 10/18/2009   Method used:   Electronically to        [facility] [phone]* (retail)       [address]       [place], [place]  [address]       Ph: [phone]       Fax: [phone]   RxID:   [id number] SYNTHROID 50 MCG TABS (LEVOTHYROXINE  SODIUM) Take 1 tablet by mouth once a day  #30 x 6   Entered and Authorized by:   Neena Rhymes MD   Signed by:   Neena Rhymes MD on 10/18/2009   Method used:   Electronically to        [facility] [phone]* (retail)       [address]       [place], [place]  [address]       Ph: [phone]       Fax: [phone]   RxID:   [id number]

## 2010-06-26 NOTE — Letter (Signed)
   [facility] [address] [place], [place] [address] [phone]     July 19, 2009   Marymargaret Kirchoff [address] [place], [place] [address]  RE:  LAB RESULTS  Dear  Ms. Prohaska,  The following is an interpretation of your most recent lab tests.  Please take note of any instructions provided or changes to medications that have resulted from your lab work.  LIPID PANEL:  Stable - no changes needed Triglyceride: 243   Cholesterol: 206   LDL: 106   HDL: 51   Chol/HDL%:  4.0 Ratio   DIABETIC STUDIES:  Excellent - no changes needed Blood Glucose: 98   HgbA1C: 5.7      Sincerely Yours,    Lemont Fillers FNP

## 2010-06-28 NOTE — Medication Information (Signed)
Summary: Formulary Letter Regarding Boniva/[employer]  Formulary Letter Regarding Boniva/[employer]   Imported By: Lanelle Bal 06/19/2010 12:55:04  _____________________________________________________________________  External Attachment:    Type:   Image     Comment:   External Document

## 2010-06-28 NOTE — Medication Information (Signed)
Summary: Formulary Letter Regarding Boniva/[employer]  Formulary Letter Regarding Boniva/[employer]   Imported By: Lanelle Bal 06/11/2010 10:04:32  _____________________________________________________________________  External Attachment:    Type:   Image     Comment:   External Document

## 2010-07-20 ENCOUNTER — Encounter: Payer: Self-pay | Admitting: Family Medicine

## 2010-07-26 ENCOUNTER — Telehealth: Payer: Self-pay | Admitting: Family Medicine

## 2010-08-02 NOTE — Progress Notes (Signed)
Summary: Boniva not covered  Phone Note Other Incoming   Summary of Call: Office received fax from patient insurance company stating that Sandrea Hammond will not be covered under her plan. It notes that the preferred meds are Alendronate Sodium (tier 2), Actonel (tier 3), Atelvia (tier 3). Can the patient switch to one of these above meds or should the office to to request an exception? Please advise. Initial call taken by: Lucious Groves CMA,  July 26, 2010 9:26 AM  Follow-up for Phone Call        this was changed to Actonel on 04/26/10 Follow-up by: Neena Rhymes MD,  July 26, 2010 9:44 AM  Additional Follow-up for Phone Call Additional follow up Details #1::        Thanks.  Additional Follow-up by: Lucious Groves CMA,  July 26, 2010 10:21 AM

## 2010-08-07 NOTE — Medication Information (Signed)
Summary: Boniva Not Covered  Boniva Not Covered   Imported By: Maryln Gottron 08/01/2010 09:49:51  _____________________________________________________________________  External Attachment:    Type:   Image     Comment:   External Document

## 2010-09-17 ENCOUNTER — Other Ambulatory Visit: Payer: Self-pay | Admitting: *Deleted

## 2010-09-17 MED ORDER — LEVOTHYROXINE SODIUM 50 MCG PO TABS: 50.0000 ug | ORAL_TABLET | Freq: Every day | ORAL | Status: DC

## 2010-09-17 NOTE — Telephone Encounter (Signed)
Sent refills to last until pt is due for TSH check.

## 2010-09-25 ENCOUNTER — Encounter: Payer: Self-pay | Admitting: Family Medicine

## 2010-09-25 ENCOUNTER — Encounter: Payer: Self-pay | Admitting: *Deleted

## 2010-12-15 ENCOUNTER — Other Ambulatory Visit: Payer: Self-pay | Admitting: Family Medicine

## 2010-12-17 NOTE — Telephone Encounter (Signed)
Refill sent, due for CPX. 

## 2011-01-13 ENCOUNTER — Other Ambulatory Visit: Payer: Self-pay | Admitting: Family

## 2011-01-16 ENCOUNTER — Other Ambulatory Visit: Payer: Self-pay | Admitting: Family Medicine

## 2011-01-16 NOTE — Telephone Encounter (Signed)
Pending Appt 01-30-11

## 2011-01-30 ENCOUNTER — Encounter: Payer: Self-pay | Admitting: Family Medicine

## 2011-01-30 ENCOUNTER — Ambulatory Visit (INDEPENDENT_AMBULATORY_CARE_PROVIDER_SITE_OTHER): Payer: Medicare Other | Admitting: Family Medicine

## 2011-01-30 VITALS — BP 130/74 | Ht 60.0 in | Wt 121.2 lb

## 2011-01-30 DIAGNOSIS — E785 Hyperlipidemia, unspecified: Secondary | ICD-10-CM

## 2011-01-30 DIAGNOSIS — M810 Age-related osteoporosis without current pathological fracture: Secondary | ICD-10-CM

## 2011-01-30 DIAGNOSIS — E039 Hypothyroidism, unspecified: Secondary | ICD-10-CM

## 2011-01-30 DIAGNOSIS — Z0000 Encounter for general adult medical examination without abnormal findings: Secondary | ICD-10-CM

## 2011-01-30 DIAGNOSIS — I10 Essential (primary) hypertension: Secondary | ICD-10-CM

## 2011-01-30 LAB — CBC WITH DIFFERENTIAL/PLATELET
Basophils Absolute: 0 10*3/uL (ref 0.0–0.1)
HCT: 39.1 % (ref 36.0–46.0)
Lymphocytes Relative: 16.1 % (ref 12.0–46.0)
Lymphs Abs: 1.2 10*3/uL (ref 0.7–4.0)
Monocytes Relative: 9.7 % (ref 3.0–12.0)
Neutrophils Relative %: 72 % (ref 43.0–77.0)
Platelets: 297 10*3/uL (ref 150.0–400.0)
RDW: 14.3 % (ref 11.5–14.6)
WBC: 7.7 10*3/uL (ref 4.5–10.5)

## 2011-01-30 LAB — BASIC METABOLIC PANEL
CO2: 26 mEq/L (ref 19–32)
Calcium: 9.1 mg/dL (ref 8.4–10.5)
Potassium: 4.3 mEq/L (ref 3.5–5.1)
Sodium: 139 mEq/L (ref 135–145)

## 2011-01-30 LAB — HEPATIC FUNCTION PANEL
AST: 24 U/L (ref 0–37)
Alkaline Phosphatase: 87 U/L (ref 39–117)
Bilirubin, Direct: 0.1 mg/dL (ref 0.0–0.3)
Total Protein: 7.5 g/dL (ref 6.0–8.3)

## 2011-01-30 LAB — LDL CHOLESTEROL, DIRECT: Direct LDL: 128.9 mg/dL

## 2011-01-30 LAB — LIPID PANEL
HDL: 43.6 mg/dL (ref >39.00)
Total CHOL/HDL Ratio: 5
Triglycerides: 260 mg/dL — ABNORMAL HIGH (ref 0.0–149.0)

## 2011-01-30 NOTE — Progress Notes (Signed)
  Subjective:    Patient ID: Veronica Mckenzie, female    DOB: Jan 13, 1919, 77 y.o.   MRN: [id number]  HPI Here today for CPE.  Risk Factors: Hypothyroid- chronic problem, on Synthroid HTN- chronic problem, on Norvasc, well controlled.  No CP, SOB, HAs, visual changes, edema. Physical Activity: doing some walking but not regularly. Fall Risk: lives alone, some fear of falling when she is not w/ her daughter. Depression: denies current sxs, feels Paxil is 'working well'. Hearing: normal to conversational tones, whispered voice at 6 ft ADL's: independent Cognitive: normal linear thought process, memory intact, no deficits in concentration noted. Home Safety:  Feels safe at home, 'has good neighbors'.  Has LifeLine in case of falls Height, Weight, BMI, Visual Acuity: see vitals, vision corrected to 20/20 w/ glasses Counseling:  Provided on living will, health care POA, healthy diet, minimizing fall risk.  No need for colonoscopy, pap, pt declines mammo Labs Ordered: See A&P Care Plan: See A&P    Review of Systems Patient reports no vision/ hearing changes, adenopathy,fever, weight change,  persistant/recurrent hoarseness , swallowing issues, chest pain, palpitations, edema, persistant/recurrent cough, hemoptysis, dyspnea (rest/exertional/paroxysmal nocturnal), gastrointestinal bleeding (melena, rectal bleeding), abdominal pain, significant heartburn, bowel changes, GU symptoms (dysuria, hematuria, incontinence), Gyn symptoms (abnormal  bleeding, pain),  syncope, focal weakness, memory loss, numbness & tingling, skin/hair/nail changes, abnormal bruising or bleeding, anxiety, or depression.     Objective:   Physical Exam  General Appearance:    Alert, cooperative, no distress, appears younger than stated age  Head:    Normocephalic, without obvious abnormality, atraumatic  Eyes:    PERRL, conjunctiva/corneas clear, EOM's intact, fundi    benign, both eyes  Ears:    Normal TM's and external ear  canals, both ears  Nose:   Nares normal, septum midline, mucosa normal, no drainage    or sinus tenderness  Throat:   Lips, mucosa, and tongue normal; teeth and gums normal  Neck:   Supple, symmetrical, trachea midline, no adenopathy;    Thyroid: no enlargement/tenderness/nodules  Back:     Symmetric, no curvature, ROM normal, no CVA tenderness  Lungs:     Clear to auscultation bilaterally, respirations unlabored  Chest Wall:    No tenderness or deformity   Heart:    Regular rate and rhythm, S1 and S2 normal, no murmur, rub   or gallop  Breast Exam:    No tenderness, masses, or nipple abnormality  Abdomen:     Soft, non-tender, bowel sounds active all four quadrants,    no masses, no organomegaly  Genitalia:    deferred  Rectal:    Extremities:   Extremities normal, atraumatic, no cyanosis or edema  Pulses:   2+ and symmetric all extremities  Skin:   Skin color, texture, turgor normal, no rashes or lesions  Lymph nodes:   Cervical, supraclavicular, and axillary nodes normal  Neurologic:   CNII-XII intact, normal strength, sensation and reflexes    throughout          Assessment & Plan:

## 2011-01-30 NOTE — Patient Instructions (Signed)
We'll notify you of your lab results Keep up the good work!  You look great! Call with any questions or concerns Have a great holiday season!!

## 2011-02-02 LAB — VITAMIN D 1,25 DIHYDROXY
Vitamin D2 1, 25 (OH)2: <8 pg/mL
Vitamin D3 1, 25 (OH)2: 40 pg/mL

## 2011-02-04 ENCOUNTER — Telehealth: Payer: Self-pay

## 2011-02-04 NOTE — Telephone Encounter (Signed)
Called to make pt aware of lab results.  Left a message for pt to return call.  

## 2011-02-04 NOTE — Telephone Encounter (Signed)
Pt daughter aware of results copy mail.

## 2011-02-05 ENCOUNTER — Telehealth: Payer: Self-pay

## 2011-02-05 NOTE — Telephone Encounter (Signed)
Labs mailed

## 2011-02-05 NOTE — Telephone Encounter (Signed)
Message copied by [name] on Tue Feb 05, 2011  4:51 PM ------      Message from: Sheliah Hatch      Created: Thu Jan 31, 2011 12:03 PM       Labs look great!  Fatty part of blood (triglycerides) is elevated but this will improve w/ attention to healthy diet.  No need for cholesterol meds at this time

## 2011-02-09 NOTE — Assessment & Plan Note (Signed)
Well controlled.  Asymptomatic.  No changes.

## 2011-02-09 NOTE — Assessment & Plan Note (Signed)
Pt's PE WNL- she is doing remarkably well for her age!  Pt and daughter agree that they do not want to be aggressive in health maintenance- decline pap, mammo, colonoscopy.  Will check labs.  Anticipatory guidance provided.  Pt has living will and healthcare POA.

## 2011-02-09 NOTE — Assessment & Plan Note (Signed)
Pt and daughter decline DEXA b/c pt is already on Actonel and they do not plan on being more aggressive.  Check Vit D- supplement prn.

## 2011-02-09 NOTE — Assessment & Plan Note (Signed)
Check labs.  Adjust meds prn  

## 2011-02-09 NOTE — Assessment & Plan Note (Signed)
Pt is controlling w/ diet.  Has never been on meds.  At 77 yrs old cholesterol will have to be extremely high to warrant starting meds.  Pt and daughter in agreement.

## 2011-02-10 ENCOUNTER — Other Ambulatory Visit: Payer: Self-pay | Admitting: Family Medicine

## 2011-02-13 ENCOUNTER — Other Ambulatory Visit: Payer: Self-pay | Admitting: Family Medicine

## 2011-03-07 ENCOUNTER — Other Ambulatory Visit: Payer: Self-pay | Admitting: Family Medicine

## 2011-03-07 MED ORDER — PAROXETINE HCL 10 MG PO TABS: 10.0000 mg | ORAL_TABLET | ORAL | Status: DC

## 2011-03-07 NOTE — Telephone Encounter (Signed)
Done

## 2011-05-08 ENCOUNTER — Other Ambulatory Visit: Payer: Self-pay | Admitting: Family Medicine

## 2011-05-08 NOTE — Telephone Encounter (Signed)
rx sent to pharmacy by e-script  

## 2011-07-29 ENCOUNTER — Telehealth: Payer: Self-pay | Admitting: Family Medicine

## 2011-07-29 MED ORDER — AMLODIPINE BESYLATE 5 MG PO TABS: 5.0000 mg | ORAL_TABLET | Freq: Every day | ORAL | Status: DC

## 2011-07-29 MED ORDER — LEVOTHYROXINE SODIUM 50 MCG PO TABS: 50.0000 ug | ORAL_TABLET | Freq: Every day | ORAL | Status: DC

## 2011-07-29 NOTE — Telephone Encounter (Signed)
Refill: Synthroid 50 mcg tab abbv. Take 1 tablet daily. Qty 30. Last fill 2.3.13

## 2011-07-29 NOTE — Telephone Encounter (Signed)
rx sent to pharmacy by e-script  

## 2011-07-29 NOTE — Telephone Encounter (Signed)
Refill: Amlodipine 5mg  tab cara. Take 1 tablet by mouth one time daily. Qty 30. Last fill 2.3.13

## 2011-08-26 ENCOUNTER — Other Ambulatory Visit: Payer: Self-pay | Admitting: Family Medicine

## 2011-08-26 NOTE — Telephone Encounter (Signed)
Refill for Paroxetine 10MG  Tab ZYDU Qty 30 Take one tablet by mouth every morning Last fill date 3.3.13

## 2011-08-27 MED ORDER — PAROXETINE HCL 10 MG PO TABS: 10.0000 mg | ORAL_TABLET | ORAL | Status: DC

## 2011-08-27 NOTE — Telephone Encounter (Signed)
rx sent to pharmacy by e-script for #30 Letter has been mailed to pt address noted in the chart to advise they are overdue for cpe/ov/labs and the pt needs to contact office to set up appt    

## 2011-09-20 ENCOUNTER — Telehealth: Payer: Self-pay | Admitting: Family Medicine

## 2011-09-20 MED ORDER — RISEDRONATE SODIUM 150 MG PO TABS: 150.0000 mg | ORAL_TABLET | ORAL | Status: DC

## 2011-09-20 NOTE — Telephone Encounter (Signed)
Refill: Actonel 150mg  tab. Take 1 tablet once a month. Qty 1. Last fill 08-22-11

## 2011-09-23 ENCOUNTER — Other Ambulatory Visit: Payer: Self-pay | Admitting: Family Medicine

## 2011-09-23 NOTE — Telephone Encounter (Signed)
refill x 3 Amlodipine Besylate 5mg  tab qual Qty 30 Take one tablet by mouth one time daily  Last filled 4.3.13  Synthroid . Tab ABBV Qty 30 Take one tablet by mouth one time daily Last filled 4.3.13  Paroxetine HCL 10MG  TAB ZYDU Qty 30 Take one tablet by mouth in the morning Last filled 4.3.13  Last OV 9.5.2012

## 2011-09-24 MED ORDER — PAROXETINE HCL 10 MG PO TABS: 10.0000 mg | ORAL_TABLET | ORAL | Status: DC

## 2011-09-24 MED ORDER — LEVOTHYROXINE SODIUM 50 MCG PO TABS: 50.0000 ug | ORAL_TABLET | Freq: Every day | ORAL | Status: DC

## 2011-09-24 MED ORDER — AMLODIPINE BESYLATE 5 MG PO TABS: 5.0000 mg | ORAL_TABLET | Freq: Every day | ORAL | Status: DC

## 2011-09-24 NOTE — Telephone Encounter (Signed)
Rx sent 

## 2011-10-14 ENCOUNTER — Other Ambulatory Visit: Payer: Self-pay | Admitting: Family Medicine

## 2011-10-14 MED ORDER — LEVOTHYROXINE SODIUM 50 MCG PO TABS: 50.0000 ug | ORAL_TABLET | Freq: Every day | ORAL | Status: DC

## 2011-10-14 MED ORDER — AMLODIPINE BESYLATE 5 MG PO TABS: 5.0000 mg | ORAL_TABLET | Freq: Every day | ORAL | Status: DC

## 2011-10-14 MED ORDER — PAROXETINE HCL 10 MG PO TABS: 10.0000 mg | ORAL_TABLET | ORAL | Status: DC

## 2011-10-14 NOTE — Telephone Encounter (Signed)
rx sent to pharmacy by e-script Letter has been mailed to pt address noted in the chart to advise they are overdue for cpe/ov/labs and the pt needs to contact office to set up appt   

## 2011-10-14 NOTE — Telephone Encounter (Signed)
Refill: Amlodipine besylate 5 mg tab. Take one tablet by mouth one time daily *Office visit needed* Qty 30. Last fill 09-24-11

## 2011-11-13 ENCOUNTER — Telehealth: Payer: Self-pay | Admitting: Family Medicine

## 2011-11-13 MED ORDER — AMLODIPINE BESYLATE 5 MG PO TABS: 5.0000 mg | ORAL_TABLET | Freq: Every day | ORAL | Status: DC

## 2011-11-13 MED ORDER — LEVOTHYROXINE SODIUM 50 MCG PO TABS: 50.0000 ug | ORAL_TABLET | Freq: Every day | ORAL | Status: DC

## 2011-11-13 MED ORDER — PAROXETINE HCL 10 MG PO TABS: 10.0000 mg | ORAL_TABLET | ORAL | Status: DC

## 2011-11-13 NOTE — Telephone Encounter (Signed)
Also add norvasc oral tablet 5mg , qty 30, take one tablet by mouth one time daily *OFFICE VISIT* due* Last fill 5.23.13 Last ov 9.5.12  NOTE CPE scheduled for 9.11.13

## 2011-11-13 NOTE — Telephone Encounter (Signed)
Sent all medications via escribe for 6 months supply per verbal order from MD Tabori per noted last CPE on 9-12 and upcoming not until 9-13

## 2011-11-13 NOTE — Telephone Encounter (Signed)
Refill: Synthroid 50 mcg tab. Take one tablet by mouth one time daily. Qty 30. Last fill 10-17-11 Paroxetine hcl 10mg  tab. Take one tablet by mouth in the morning *Office visit due* Qty 30. Last fill 10-17-11

## 2012-01-19 ENCOUNTER — Emergency Department (INDEPENDENT_AMBULATORY_CARE_PROVIDER_SITE_OTHER): Payer: Medicare Other

## 2012-01-19 ENCOUNTER — Emergency Department
Admission: EM | Admit: 2012-01-19 | Discharge: 2012-01-19 | Disposition: A | Discharge status: Home / Self Care | Payer: Medicare Other | Source: Home / Self Care

## 2012-01-19 DIAGNOSIS — M25529 Pain in unspecified elbow: Secondary | ICD-10-CM

## 2012-01-19 DIAGNOSIS — W19XXXA Unspecified fall, initial encounter: Secondary | ICD-10-CM

## 2012-01-19 DIAGNOSIS — R0902 Hypoxemia: Secondary | ICD-10-CM

## 2012-01-19 DIAGNOSIS — S51009A Unspecified open wound of unspecified elbow, initial encounter: Secondary | ICD-10-CM

## 2012-01-19 DIAGNOSIS — S6990XA Unspecified injury of unspecified wrist, hand and finger(s), initial encounter: Secondary | ICD-10-CM

## 2012-01-19 DIAGNOSIS — S51019A Laceration without foreign body of unspecified elbow, initial encounter: Secondary | ICD-10-CM

## 2012-01-19 HISTORY — DX: Depression, unspecified: F32.A

## 2012-01-19 HISTORY — DX: Major depressive disorder, single episode, unspecified: F32.9

## 2012-01-19 MED ORDER — TETANUS-DIPHTH-ACELL PERTUSSIS 5-2.5-18.5 LF-MCG/0.5 IM SUSP: 0.5000 mL | Freq: Once | INTRAMUSCULAR | Status: DC

## 2012-01-19 MED ORDER — DOXYCYCLINE HYCLATE 100 MG PO CAPS: 100.0000 mg | ORAL_CAPSULE | Freq: Two times a day (BID) | ORAL | Status: AC

## 2012-01-19 NOTE — ED Notes (Signed)
Fell yesterday and hit right elbow, kin tear to right elbow

## 2012-01-19 NOTE — ED Provider Notes (Signed)
History     CSN: [id number]  Arrival date & time 01/19/12  1410   First MD Initiated Contact with Patient 01/19/12 1430      Chief Complaint  Patient presents with  . Extremity Laceration   HPI Comments: Per daughter, pt currently lives alone. Does report home health aide/someone to help with daily activities.  Pt slipped while attempting to use rolling walker.  No head trauma, LOC.  No CP, dizziness, SOB, weakness associated with this incident.  No recent infections.   Patient is a 78 y.o. female presenting with skin laceration.  Laceration  The incident occurred yesterday. Pain location: R elbow. Size: 7-8 cm circumferential laceration on R elbow  Injury mechanism: pt usually ambulates with rolling walker. Slipped on rolling walker yesterday and struck her R elbow.  The pain is at a severity of 3/10. Pain severity now: mild TTP across olecranon. Otherwise no pain.  She reports no foreign bodies present. Her tetanus status is unknown.     Past Medical History  Diagnosis Date  . Hypothyroidism   . Rheumatic fever   . Diverticulitis   . History of chicken pox   . Anxiety   . Hyperlipidemia   . GERD (gastroesophageal reflux disease)   . Hypertension   . Osteoporosis   . Glucose intolerance (impaired glucose tolerance)   . Anemia     Past Surgical History  Procedure Date  . Abdominal hysterectomy 1991    Family History  Problem Relation Age of Onset  . Arthritis    . Hypertension    . Coronary artery disease      History  Substance Use Topics  . Smoking status: Never Smoker   . Smokeless tobacco: Not on file  . Alcohol Use: No    OB History    Grav Para Term Preterm Abortions TAB SAB Ect Mult Living   4 4              Review of Systems  Constitutional: Negative for fever, chills and fatigue.  Eyes: Negative for visual disturbance.  Respiratory: Negative for cough, chest tightness and shortness of breath.   Cardiovascular: Negative for chest pain and  palpitations.  Gastrointestinal: Negative for nausea, vomiting, diarrhea and constipation.  Genitourinary: Negative for dysuria, urgency and pelvic pain.  Musculoskeletal: Negative for back pain and joint swelling.  Neurological: Negative for dizziness, weakness and headaches.  Psychiatric/Behavioral: Negative for confusion and disturbed wake/sleep cycle.  All other systems reviewed and are negative.    Allergies  Review of patient's allergies indicates no known allergies.  Home Medications   Current Outpatient Rx  Name Route Sig Dispense Refill  . AMLODIPINE BESYLATE 5 MG PO TABS Oral Take 1 tablet (5 mg total) by mouth daily. Office visit due 30 tablet 6  . CALCIUM CARBONATE-VITAMIN D 600-400 MG-UNIT PO TABS Oral Take 1 tablet by mouth 2 (two) times daily.      Marland Kitchen LEVOTHYROXINE SODIUM 50 MCG PO TABS Oral Take 1 tablet (50 mcg total) by mouth daily. 30 tablet 6  . LORAZEPAM 0.5 MG PO TABS Oral Take 0.5 mg by mouth 2 (two) times daily as needed. Prn severe anxiety     . PAROXETINE HCL 10 MG PO TABS Oral Take 1 tablet (10 mg total) by mouth every morning. Office visit due 30 tablet 6  . RISEDRONATE SODIUM 150 MG PO TABS Oral Take 1 tablet (150 mg total) by mouth every 30 (thirty) days. with water on empty stomach, nothing  by mouth or lie down for next 30 minutes. 1 tablet 5    BP 127/73  Pulse 98  Temp 98.5 F (36.9 C) (Oral)  Ht 5\' 1"  (1.549 m)  Wt 118 lb (53.524 kg)  BMI 22.30 kg/m2  SpO2 90%  Physical Exam  Constitutional: She appears well-developed and well-nourished.  HENT:  Head: Normocephalic and atraumatic.  Eyes: Conjunctivae are normal. Pupils are equal, round, and reactive to light.  Neck: Normal range of motion. Neck supple.  Cardiovascular: Normal rate and regular rhythm.   Pulmonary/Chest: Effort normal and breath sounds normal. She has no wheezes. She has no rales. She exhibits no tenderness.  Abdominal: Soft.  Musculoskeletal: Normal range of motion.        Arms: Skin:       R post elbow circumferential laceration measuring approx 7-8 cm.  Noted 0.5x0.5 cm area of ecchymoses at circumferential midpoint of laceration.     ED Course  Procedures (including critical care time)  Labs Reviewed - No data to display Dg Chest 2 View  01/19/2012  *RADIOLOGY REPORT*  Clinical Data: Hypoxia.  CHEST - 2 VIEW  Comparison: None.  Findings: The heart is mildly enlarged and there is tortuosity and ectasia of the thoracic aorta.  The lungs are clear.  A moderate sized hiatal hernia is noted.  The bony thorax is intact.  IMPRESSION: No acute cardiopulmonary findings.   Original Report Authenticated By: P. Loralie Champagne, M.D.    Dg Elbow Complete Right  01/19/2012  *RADIOLOGY REPORT*  Clinical Data: Fall with right elbow injury and pain.  RIGHT ELBOW - COMPLETE 3+ VIEW  Comparison: None  Findings: No evidence of acute fracture, subluxation or dislocation identified.  No joint effusion noted.  No radio-opaque foreign bodies are present.  No focal bony lesions are noted.  The joint spaces are unremarkable.  IMPRESSION: No evidence of acute abnormality.   Original Report Authenticated By: Rosendo Gros, M.D.      1. Laceration of skin of elbow   2. Oxygen desaturation       MDM  Area copiously cleansed at bedside.  Skin flap adherent with small amount of early granulation around edges.  Plan to place Tegaderm dressing to help with wound healing.  Overall skin condition is such that suturing may cause secondary skin tearing and skin edges are too far apart for dermabond to be fully effective in re approximation.  No noted fractures on elbow imaging.  Will place on doxy for soft tissue infectious coverage given age.   Transient Hypoxia: Pt with noted O2 saturation in upper 80s to mid/low 90s on initial presentation with no cardiopulmonary complaints.   No current signs of intrapulmonary infection currently.  Both pt and daughter state that pt is at her  baseline.  No red flags associated with recent falls on extensive review of HPI and ROS.  CXR negative for PNA.  O2 sats noted to be in low-mid 90s at time of discharge.  Discussed general, infectious, and cardiorespiratory red flags for reevaluation.   Pt/daughter instructed to follow up with PCP in 1-2 days.  Plan for laceration follow up in 3-5 days.      The patient and/or caregiver has been counseled thoroughly with regard to treatment plan and/or medications prescribed including dosage, schedule, interactions, rationale for use, and possible side effects and they verbalize understanding. Diagnoses and expected course of recovery discussed and will return if not improved as expected or if the condition worsens. Patient  and/or caregiver verbalized understanding.                  Doree Albee, MD 01/19/12 1843

## 2012-01-20 ENCOUNTER — Telehealth: Payer: Self-pay | Admitting: Family Medicine

## 2012-01-20 NOTE — Telephone Encounter (Signed)
Advised pt daughter that we did receive the notes from UC, daughter stated UC MD noted to follow up with PCP per noted 88 ) upon arrival asymptomatic and noted the O2 came up to the 90's, pt has upcoming CPE 02-05-12 MD [name] gave verbal for pt to wait for follow up until this OV, however if pt notes SOB, Chest Pain, or worsening symptoms to please go the ER, pt daughter understood and will call if any further assistance needed, MD [name] made aware verbally

## 2012-01-20 NOTE — Telephone Encounter (Signed)
Pt daughter came in and wanted to make sure that you received the urgent care notes from the weekend. She would like to talk with someone, amym.

## 2012-01-23 ENCOUNTER — Emergency Department (INDEPENDENT_AMBULATORY_CARE_PROVIDER_SITE_OTHER)
Admission: EM | Admit: 2012-01-23 | Discharge: 2012-01-23 | Disposition: A | Discharge status: Home / Self Care | Payer: Medicare Other | Source: Home / Self Care

## 2012-01-23 ENCOUNTER — Encounter: Payer: Self-pay | Admitting: *Deleted

## 2012-01-23 DIAGNOSIS — S51009A Unspecified open wound of unspecified elbow, initial encounter: Secondary | ICD-10-CM

## 2012-01-23 DIAGNOSIS — Z5189 Encounter for other specified aftercare: Secondary | ICD-10-CM

## 2012-01-23 DIAGNOSIS — S51011A Laceration without foreign body of right elbow, initial encounter: Secondary | ICD-10-CM

## 2012-01-23 NOTE — ED Notes (Signed)
Tegaderm removed without complication. Site it without edema, or surrounding redness.

## 2012-01-23 NOTE — ED Provider Notes (Addendum)
History     CSN: [id number]  Arrival date & time 01/23/12  1401   First MD Initiated Contact with Patient 01/23/12 1443      Chief Complaint  Patient presents with  . Wound Check    right elbow   HPI Pt is here today for a laceration recheck. Pt was noted to have elbow laceration s/p fall over the weekend. A R elbow xray was negative for fracture. A tegaderm was placed over the affected area area after cleansing and irrigation. Pt was also placed on doxycycline for antibiotic prophylaxis. TDap also given.  Today, daughter and pt report that the wound is healing well.  No fevers, chills, swelling, pain. Pt has been compliant with oral antibiotics.  No further trauma to affected area.   Pt also noted to have transient hypoxia during this visit.  O2 sats improved to mid 90s prior to discharge.  CXR was negative for PNA.  Pt was asymptomatic at the time and remains this way today. No SOB, wheezing, or chest pain.   Past Medical History  Diagnosis Date  . Hypothyroidism   . Rheumatic fever   . Diverticulitis   . History of chicken pox   . Anxiety   . Hyperlipidemia   . GERD (gastroesophageal reflux disease)   . Hypertension   . Osteoporosis   . Glucose intolerance (impaired glucose tolerance)   . Anemia   . Depression     Past Surgical History  Procedure Date  . Abdominal hysterectomy 1991  . Abdominal hysterectomy     Family History  Problem Relation Age of Onset  . Arthritis    . Hypertension    . Coronary artery disease      History  Substance Use Topics  . Smoking status: Never Smoker   . Smokeless tobacco: Not on file  . Alcohol Use: No    OB History    Grav Para Term Preterm Abortions TAB SAB Ect Mult Living   4 4              Review of Systems  Allergies  Review of patient's allergies indicates no known allergies.  Home Medications   Current Outpatient Rx  Name Route Sig Dispense Refill  . AMLODIPINE BESYLATE 5 MG PO TABS Oral Take 1 tablet  (5 mg total) by mouth daily. Office visit due 30 tablet 6  . CALCIUM CARBONATE-VITAMIN D 600-400 MG-UNIT PO TABS Oral Take 1 tablet by mouth 2 (two) times daily.      Marland Kitchen DOXYCYCLINE HYCLATE 100 MG PO CAPS Oral Take 1 capsule (100 mg total) by mouth 2 (two) times daily. 14 capsule 0  . LEVOTHYROXINE SODIUM 50 MCG PO TABS Oral Take 1 tablet (50 mcg total) by mouth daily. 30 tablet 6  . LORAZEPAM 0.5 MG PO TABS Oral Take 0.5 mg by mouth 2 (two) times daily as needed. Prn severe anxiety     . PAROXETINE HCL 10 MG PO TABS Oral Take 1 tablet (10 mg total) by mouth every morning. Office visit due 30 tablet 6  . RISEDRONATE SODIUM 150 MG PO TABS Oral Take 1 tablet (150 mg total) by mouth every 30 (thirty) days. with water on empty stomach, nothing by mouth or lie down for next 30 minutes. 1 tablet 5    BP 151/79  Pulse 93  Temp 98.4 F (36.9 C) (Oral)  Resp 14  SpO2 94%  Physical Exam  Constitutional: She appears well-developed and well-nourished.  HENT:  Head:  Normocephalic and atraumatic.  Eyes: Conjunctivae are normal. Pupils are equal, round, and reactive to light.  Neck: Normal range of motion. Neck supple.  Cardiovascular: Normal rate and regular rhythm.   Pulmonary/Chest: Effort normal and breath sounds normal.  Abdominal: Soft.    ED Course  Procedures (including critical care time)  Labs Reviewed - No data to display No results found.   1. Laceration of right elbow       MDM  Clinically improving with tegaderm.  Dressing changed today.  No signs of infection.  Discussed infectious and general red flags at length with pt and daughter.  Plan for follow up in 5-7 days for reevaluation and dressing change.    Resp status currently stable with O2 sat of 94% and reassuring.  Also discussed resp red flags.      The patient and/or caregiver has been counseled thoroughly with regard to treatment plan and/or medications prescribed including dosage, schedule, interactions,  rationale for use, and possible side effects and they verbalize understanding. Diagnoses and expected course of recovery discussed and will return if not improved as expected or if the condition worsens. Patient and/or caregiver verbalized understanding.             Doree Albee, MD 01/23/12 1191  Doree Albee, MD 01/28/12 [phone]

## 2012-01-29 ENCOUNTER — Telehealth: Payer: Self-pay | Admitting: *Deleted

## 2012-01-29 ENCOUNTER — Emergency Department (INDEPENDENT_AMBULATORY_CARE_PROVIDER_SITE_OTHER)
Admission: EM | Admit: 2012-01-29 | Discharge: 2012-01-29 | Disposition: A | Discharge status: Home / Self Care | Payer: Medicare Other | Source: Home / Self Care | Attending: Family Medicine | Admitting: Family Medicine

## 2012-01-29 ENCOUNTER — Encounter: Payer: Self-pay | Admitting: *Deleted

## 2012-01-29 DIAGNOSIS — Z4800 Encounter for change or removal of nonsurgical wound dressing: Secondary | ICD-10-CM

## 2012-01-29 MED ORDER — DOXYCYCLINE HYCLATE 100 MG PO TABS: 100.0000 mg | ORAL_TABLET | Freq: Two times a day (BID) | ORAL | Status: AC

## 2012-01-29 NOTE — ED Notes (Addendum)
Tegaderm removed without complication. Old blood on dressing. No surrounding redness or swelling. Patient has finished antibiotic.

## 2012-01-29 NOTE — ED Provider Notes (Signed)
History     CSN: [id number]  Arrival date & time 01/29/12  1010   First MD Initiated Contact with Patient 01/29/12 1057      Chief Complaint  Patient presents with  . Wound Check      HPI Comments: Patient returns for follow-up and dressing change.  She has had no further right elbow pain.  Her Tegaderm bandage has remained in place.  Tegaderm removed without difficulty.  The history is provided by the patient and a relative.    Past Medical History  Diagnosis Date  . Hypothyroidism   . Rheumatic fever   . Diverticulitis   . History of chicken pox   . Anxiety   . Hyperlipidemia   . GERD (gastroesophageal reflux disease)   . Hypertension   . Osteoporosis   . Glucose intolerance (impaired glucose tolerance)   . Anemia   . Depression     Past Surgical History  Procedure Date  . Abdominal hysterectomy 1991  . Abdominal hysterectomy     Family History  Problem Relation Age of Onset  . Arthritis    . Hypertension    . Coronary artery disease      History  Substance Use Topics  . Smoking status: Never Smoker   . Smokeless tobacco: Not on file  . Alcohol Use: No    OB History    Grav Para Term Preterm Abortions TAB SAB Ect Mult Living   4 4              Review of Systems  All other systems reviewed and are negative.    Allergies  Review of patient's allergies indicates no known allergies.  Home Medications   Current Outpatient Rx  Name Route Sig Dispense Refill  . AMLODIPINE BESYLATE 5 MG PO TABS Oral Take 1 tablet (5 mg total) by mouth daily. Office visit due 30 tablet 6  . CALCIUM CARBONATE-VITAMIN D 600-400 MG-UNIT PO TABS Oral Take 1 tablet by mouth 2 (two) times daily.      Marland Kitchen DOXYCYCLINE HYCLATE 100 MG PO TABS Oral Take 1 tablet (100 mg total) by mouth 2 (two) times daily. 6 tablet 0  . LEVOTHYROXINE SODIUM 50 MCG PO TABS Oral Take 1 tablet (50 mcg total) by mouth daily. 30 tablet 6  . LORAZEPAM 0.5 MG PO TABS Oral Take 0.5 mg by mouth 2 (two)  times daily as needed. Prn severe anxiety     . PAROXETINE HCL 10 MG PO TABS Oral Take 1 tablet (10 mg total) by mouth every morning. Office visit due 30 tablet 6  . RISEDRONATE SODIUM 150 MG PO TABS Oral Take 1 tablet (150 mg total) by mouth every 30 (thirty) days. with water on empty stomach, nothing by mouth or lie down for next 30 minutes. 1 tablet 5    BP 130/79  Pulse 92  Temp 97.4 F (36.3 C) (Oral)  Resp 16  Physical Exam Nursing notes and Vital Signs reviewed. Appearance:  Patient appears healthy, stated age, and in no acute distress Eyes:  Pupils are equal, round, and reactive to light and accomodation.  Extraocular movement is intact.  Conjunctivae are not inflamed  Right elbow:  Compared to previous chart photo, surrounding wound erythema has resolved.  No drainage from wound.  Edge of skin tear flap is slightly necrotic; there is a 5mm gap of exposed dermis with good granulation tissue.  ED Course  Procedures  none      1. Dressing change  or removal, nonsurgical wound       MDM  Wound healing well.  Cleaned wound with saline, and reapplied Tegaderm, followed by light ace wrap.  Continue doxycycline for 3 more days. Leave present dressing in place for one week.  May use ace wrap as needed.  Return for any signs of infection.  Patient has a follow-up visit with her PCP in one week.        Lattie Haw, MD 01/29/12 [phone]

## 2012-02-05 ENCOUNTER — Ambulatory Visit (INDEPENDENT_AMBULATORY_CARE_PROVIDER_SITE_OTHER): Payer: Medicare Other | Admitting: Family Medicine

## 2012-02-05 ENCOUNTER — Encounter: Payer: Self-pay | Admitting: Family Medicine

## 2012-02-05 VITALS — BP 129/79 | HR 83 | Temp 98.3°F | Ht 60.0 in | Wt 112.8 lb

## 2012-02-05 DIAGNOSIS — I10 Essential (primary) hypertension: Secondary | ICD-10-CM

## 2012-02-05 DIAGNOSIS — D509 Iron deficiency anemia, unspecified: Secondary | ICD-10-CM

## 2012-02-05 DIAGNOSIS — E039 Hypothyroidism, unspecified: Secondary | ICD-10-CM

## 2012-02-05 DIAGNOSIS — M810 Age-related osteoporosis without current pathological fracture: Secondary | ICD-10-CM

## 2012-02-05 DIAGNOSIS — Z0000 Encounter for general adult medical examination without abnormal findings: Secondary | ICD-10-CM

## 2012-02-05 DIAGNOSIS — Z23 Encounter for immunization: Secondary | ICD-10-CM

## 2012-02-05 LAB — CBC WITH DIFFERENTIAL/PLATELET
Basophils Relative: 0.1 % (ref 0.0–3.0)
Eosinophils Relative: 1.6 % (ref 0.0–5.0)
HCT: 41 % (ref 36.0–46.0)
Hemoglobin: 13.7 g/dL (ref 12.0–15.0)
Lymphocytes Relative: 18.6 % (ref 12.0–46.0)
Lymphs Abs: 1.2 10*3/uL (ref 0.7–4.0)
Monocytes Relative: 8.3 % (ref 3.0–12.0)
Neutro Abs: 4.5 10*3/uL (ref 1.4–7.7)
RBC: 4.31 Mil/uL (ref 3.87–5.11)
WBC: 6.4 10*3/uL (ref 4.5–10.5)

## 2012-02-05 MED ORDER — AMLODIPINE BESYLATE 5 MG PO TABS: 5.0000 mg | ORAL_TABLET | Freq: Every day | ORAL | Status: DC

## 2012-02-05 MED ORDER — PAROXETINE HCL 10 MG PO TABS: 10.0000 mg | ORAL_TABLET | ORAL | Status: DC

## 2012-02-05 MED ORDER — RISEDRONATE SODIUM 150 MG PO TABS: 150.0000 mg | ORAL_TABLET | ORAL | Status: DC

## 2012-02-05 NOTE — Progress Notes (Signed)
  Subjective:    Patient ID: Veronica Mckenzie, female    DOB: Apr 15, 1919, 78 y.o.   MRN: [id number]  HPI Here today for CPE.  Risk Factors: HTN- chronic problem, adequate control on Norvasc. No CP, SOB, HAs, visual changes, edema.  Hypothyroid- chronic problem, on Synthroid daily.  Denies heat/cold intolerance, fatigue Osteoporosis- chronic problem, on Actonel, Ca+ Vit D.  No longer having DEXAs. Anemia- chronic problem, denies fatigue, weakness, no pallor Physical Activity:  limited Fall Risk: fell 2 weeks ago, high risk, using walker Depression: chronic problem, on Paxil Hearing: decreased to whispered voice and conversational tones ADL's: independent Cognitive: normal linear thought process, memory and attention intact Home Safety: feels safe at home, good neighbors, daughter local Height, Weight, BMI, Visual Acuity: see vitals, vision corrected to 20/20 w/ glasses Counseling: no longer having colonoscopy, mammo, dexa, or pap. Labs Ordered: See A&P Care Plan: See A&P    Review of Systems Patient reports no vision/ hearing changes, adenopathy,fever, weight change,  persistant/recurrent hoarseness , swallowing issues, chest pain, palpitations, edema, persistant/recurrent cough, hemoptysis, dyspnea (rest/exertional/paroxysmal nocturnal), gastrointestinal bleeding (melena, rectal bleeding), abdominal pain, significant heartburn, bowel changes, GU symptoms (dysuria, hematuria, incontinence), Gyn symptoms (abnormal  bleeding, pain),  syncope, focal weakness, memory loss, numbness & tingling, skin/hair/nail changes.     Objective:   Physical Exam General Appearance:    Alert, cooperative, no distress, appears stated age  Head:    Normocephalic, without obvious abnormality, atraumatic  Eyes:    PERRL, conjunctiva/corneas clear, EOM's intact, fundi    benign, both eyes  Ears:    Normal TM's and external ear canals, both ears  Nose:   Nares normal, septum midline, mucosa normal, no drainage   or sinus tenderness  Throat:   Lips, mucosa, and tongue normal  Neck:   Supple, symmetrical, trachea midline, no adenopathy;    Thyroid: no enlargement/tenderness/nodules  Back:     Symmetric, ROM normal, no CVA tenderness  Lungs:     Clear to auscultation bilaterally, respirations unlabored  Chest Wall:    No tenderness or deformity   Heart:    Regular rate and rhythm, S1 and S2 normal, no murmur, rub   or gallop  Breast Exam:    Deferred  Abdomen:     Soft, non-tender, bowel sounds active all four quadrants,    no masses, no organomegaly  Genitalia:    Deferred  Rectal:    Extremities:   Extremities normal, atraumatic, no cyanosis or edema  Pulses:   2+ and symmetric all extremities  Skin:   Skin color, texture, turgor normal, no rashes or lesions.  + skin tear on R elbow- well healing, no evidence of infxn  Lymph nodes:   Cervical, supraclavicular, and axillary nodes normal  Neurologic:   CNII-XII intact, normal strength, sensation and reflexes    throughout          Assessment & Plan:

## 2012-02-05 NOTE — Patient Instructions (Addendum)
You look great!  Keep up the good work! We'll notify you of your lab results and make any changes if needed Apply Funginail to the nails as directed on the package Apply Neosporin or Bacitracin to the elbow and apply gauze or bandaid Call with any questions or concerns Hang in there!!!

## 2012-02-06 LAB — HEPATIC FUNCTION PANEL
ALT: 18 U/L (ref 0–35)
Albumin: 4.1 g/dL (ref 3.5–5.2)
Total Protein: 8.1 g/dL (ref 6.0–8.3)

## 2012-02-06 LAB — LIPID PANEL
Cholesterol: 214 mg/dL — ABNORMAL HIGH (ref 0–200)
Triglycerides: 202 mg/dL — ABNORMAL HIGH (ref 0.0–149.0)

## 2012-02-06 NOTE — Assessment & Plan Note (Signed)
Chronic problem, adequate control.  Asymptomatic.  No changes. 

## 2012-02-06 NOTE — Assessment & Plan Note (Signed)
Chronic problem, tolerating meds w/out difficulty.  Adjust meds prn.

## 2012-02-06 NOTE — Assessment & Plan Note (Signed)
Chronic problem, check labs.  Treat prn.

## 2012-02-06 NOTE — Assessment & Plan Note (Signed)
Chronic problem, on bisphosphonate therapy.  Not interested in repeat DEXA.  Continue current meds, check Vit D.

## 2012-02-06 NOTE — Assessment & Plan Note (Signed)
Pt doing remarkably well for her age.  No longer interested in health maintenance- pap, mammo, DEXA, colonoscopy.  Check labs.  Has POA and living will in place.  Anticipatory guidance provided.

## 2012-02-07 LAB — BASIC METABOLIC PANEL
CO2: 20 mEq/L (ref 19–32)
Calcium: 9.5 mg/dL (ref 8.4–10.5)
Chloride: 106 mEq/L (ref 96–112)
Creatinine, Ser: 0.9 mg/dL (ref 0.4–1.2)
Glucose, Bld: 87 mg/dL (ref 70–99)

## 2012-02-07 LAB — LDL CHOLESTEROL, DIRECT: Direct LDL: 135.5 mg/dL

## 2012-02-09 LAB — VITAMIN D 1,25 DIHYDROXY
Vitamin D 1, 25 (OH)2 Total: 48 pg/mL (ref 18–72)
Vitamin D2 1, 25 (OH)2: <8 pg/mL

## 2012-12-23 ENCOUNTER — Other Ambulatory Visit: Payer: Self-pay | Admitting: Family Medicine

## 2012-12-25 ENCOUNTER — Telehealth: Payer: Self-pay | Admitting: *Deleted

## 2012-12-25 MED ORDER — AMLODIPINE BESYLATE 5 MG PO TABS: 5.0000 mg | ORAL_TABLET | Freq: Every day | ORAL | Status: DC

## 2012-12-25 MED ORDER — RISEDRONATE SODIUM 150 MG PO TABS: 150.0000 mg | ORAL_TABLET | ORAL | Status: DC

## 2012-12-25 NOTE — Telephone Encounter (Signed)
Spoke with the pt's daughter and rx's was sent to the pharmacy by e-script.//AB/CMA

## 2012-12-25 NOTE — Telephone Encounter (Signed)
Patient's daughter wants to know why the patient's Synthroid rx changed over to the generic. She is not sure if this change was from Dr. [name] or the pharmacy. She also wants to know if additional refills can be sent to the pharmacy so she does not have to call the patient's refill in every month. Please advise.

## 2012-12-25 NOTE — Telephone Encounter (Signed)
Last refill:unknown Last OV:02-05-12-pt is scheduled for CPE on 03-10-13 Please advise.//AB/CMA

## 2012-12-26 NOTE — Telephone Encounter (Signed)
Please call family and see if I am provider, we don't have any record of script coming from this office

## 2012-12-28 MED ORDER — PAROXETINE HCL 10 MG PO TABS: 10.0000 mg | ORAL_TABLET | ORAL | Status: DC

## 2012-12-28 NOTE — Telephone Encounter (Signed)
Ok for #30, 3 refills 

## 2012-12-28 NOTE — Telephone Encounter (Signed)
Rx sent to the pharmacy by e-script.//AB/CMA 

## 2012-12-28 NOTE — Telephone Encounter (Signed)
Spoke with the pt's daughter and she stated that it's the Paxil 10mg  not the Ativan.//AB/CMA

## 2013-02-17 ENCOUNTER — Encounter: Payer: Medicare Other | Admitting: Family Medicine

## 2013-03-09 ENCOUNTER — Telehealth: Payer: Self-pay

## 2013-03-09 NOTE — Telephone Encounter (Signed)
Medication and allergies: done   for 90 day supply (mail order) [facility] [address] for local pharmacy   Immunizations due: Admin flu vaccine upon arrival  A/P:  Last:  PAP:   na  MMG: na  Dexa: na  CCS:na DM:na HTN: due  Lipids: due  To Discuss with Provider: Spoke with daughter and per last CPE patient is not interested in further HM ie MMG, etc Please refill medications for 1 year Mail lab results to DPR (daughter)

## 2013-03-10 ENCOUNTER — Other Ambulatory Visit: Payer: Self-pay | Admitting: General Practice

## 2013-03-10 ENCOUNTER — Ambulatory Visit (INDEPENDENT_AMBULATORY_CARE_PROVIDER_SITE_OTHER): Payer: Medicare Other | Admitting: Family Medicine

## 2013-03-10 ENCOUNTER — Encounter: Payer: Self-pay | Admitting: General Practice

## 2013-03-10 ENCOUNTER — Encounter: Payer: Self-pay | Admitting: Family Medicine

## 2013-03-10 VITALS — BP 130/80 | HR 98 | Temp 98.0°F | Resp 16 | Wt 118.0 lb

## 2013-03-10 DIAGNOSIS — E785 Hyperlipidemia, unspecified: Secondary | ICD-10-CM

## 2013-03-10 DIAGNOSIS — M810 Age-related osteoporosis without current pathological fracture: Secondary | ICD-10-CM

## 2013-03-10 DIAGNOSIS — E039 Hypothyroidism, unspecified: Secondary | ICD-10-CM

## 2013-03-10 DIAGNOSIS — Z0000 Encounter for general adult medical examination without abnormal findings: Secondary | ICD-10-CM

## 2013-03-10 DIAGNOSIS — I10 Essential (primary) hypertension: Secondary | ICD-10-CM

## 2013-03-10 DIAGNOSIS — Z23 Encounter for immunization: Secondary | ICD-10-CM

## 2013-03-10 LAB — HEPATIC FUNCTION PANEL
ALT: 14 U/L (ref 0–35)
AST: 21 U/L (ref 0–37)
Albumin: 3.8 g/dL (ref 3.5–5.2)
Alkaline Phosphatase: 101 U/L (ref 39–117)
Bilirubin, Direct: 0.1 mg/dL (ref 0.0–0.3)
Total Protein: 7.7 g/dL (ref 6.0–8.3)

## 2013-03-10 LAB — BASIC METABOLIC PANEL
CO2: 24 mEq/L (ref 19–32)
Calcium: 9 mg/dL (ref 8.4–10.5)
Chloride: 106 mEq/L (ref 96–112)
Glucose, Bld: 95 mg/dL (ref 70–99)
Sodium: 139 mEq/L (ref 135–145)

## 2013-03-10 LAB — CBC WITH DIFFERENTIAL/PLATELET
Basophils Absolute: 0 10*3/uL (ref 0.0–0.1)
Eosinophils Relative: 3.6 % (ref 0.0–5.0)
HCT: 39.5 % (ref 36.0–46.0)
Hemoglobin: 13.6 g/dL (ref 12.0–15.0)
Lymphocytes Relative: 15.7 % (ref 12.0–46.0)
Lymphs Abs: 1.2 10*3/uL (ref 0.7–4.0)
Monocytes Relative: 9.6 % (ref 3.0–12.0)
Platelets: 326 10*3/uL (ref 150.0–400.0)
RDW: 14.5 % (ref 11.5–14.6)
WBC: 7.8 10*3/uL (ref 4.5–10.5)

## 2013-03-10 LAB — LDL CHOLESTEROL, DIRECT: Direct LDL: 137.3 mg/dL

## 2013-03-10 LAB — LIPID PANEL
Total CHOL/HDL Ratio: 5
Triglycerides: 190 mg/dL — ABNORMAL HIGH (ref 0.0–149.0)

## 2013-03-10 MED ORDER — PAROXETINE HCL 10 MG PO TABS: 10.0000 mg | ORAL_TABLET | ORAL | Status: DC

## 2013-03-10 MED ORDER — AMLODIPINE BESYLATE 5 MG PO TABS: 5.0000 mg | ORAL_TABLET | Freq: Every day | ORAL | Status: AC

## 2013-03-10 MED ORDER — RISEDRONATE SODIUM 150 MG PO TABS: 150.0000 mg | ORAL_TABLET | ORAL | Status: DC

## 2013-03-10 MED ORDER — LEVOTHYROXINE SODIUM 50 MCG PO TABS: ORAL_TABLET | ORAL | Status: AC

## 2013-03-10 NOTE — Assessment & Plan Note (Signed)
Pt's PE WNL.  Pt declines all health maintenance due to her age.  Check labs.  Anticipatory guidance provided.

## 2013-03-10 NOTE — Progress Notes (Signed)
  Subjective:    Patient ID: Veronica Mckenzie, female    DOB: Oct 14, 1918, 79 y.o.   MRN: [id number]  HPI Here today for CPE.  Risk Factors: HTN- chronic problem, on Norvasc.  No CP, SOB, HAs, visual changes,edema Hyperlipidemia- not currently on medication.  Attempting to control w/ healthy diet Hypothyroid- chronic problem, on Synthroid .  Denies excessive fatigue Physical Activity: walking regularly w/ walker Depression: no current sxs of depression, anxiety is well controlled on Paxil Hearing: normal to conversational tones and decreased to whispered ADL's: independent Cognitive: normal linear thought process, mild decrease in short term memory, but attention and focus intact Home Safety: safe at home, lives independently, has good neighbor support.  Daughter has hired part time care giver Height, Weight, BMI, Visual Acuity: see vitals, vision corrected to 20/20 w/ glasses Counseling: pt declines mammo, DEXA, pap, colonoscopy.   Labs Ordered: See A&P Care Plan: See A&P    Review of Systems Patient reports no vision/ hearing changes, adenopathy,fever, weight change,  persistant/recurrent hoarseness , swallowing issues, chest pain, palpitations, edema, persistant/recurrent cough, hemoptysis, dyspnea (rest/exertional/paroxysmal nocturnal), gastrointestinal bleeding (melena, rectal bleeding), abdominal pain, significant heartburn, bowel changes, GU symptoms (dysuria, hematuria, incontinence), Gyn symptoms (abnormal  bleeding, pain),  syncope, focal weakness, memory loss, numbness & tingling, skin/hair/nail changes, abnormal bruising or bleeding, anxiety, or depression.     Objective:   Physical Exam General Appearance:    Alert, cooperative, no distress, appears younger than stated age  Head:    Normocephalic, without obvious abnormality, atraumatic  Eyes:    PERRL, conjunctiva/corneas clear, EOM's intact, fundi    benign, both eyes  Ears:    Normal TM's and external ear canals, both  ears  Nose:   Nares normal, septum midline, mucosa normal, no drainage    or sinus tenderness  Throat:   Lips, mucosa, and tongue normal; teeth and gums normal  Neck:   Supple, symmetrical, trachea midline, no adenopathy;    Thyroid: no enlargement/tenderness/nodules  Back:     Symmetric, no curvature, ROM normal, no CVA tenderness  Lungs:     Clear to auscultation bilaterally, respirations unlabored  Chest Wall:    No tenderness or deformity   Heart:    Regular rate and rhythm, S1 and S2 normal, no murmur, rub   or gallop  Breast Exam:    Deferred to GYN  Abdomen:     Soft, non-tender, bowel sounds active all four quadrants,    no masses, no organomegaly  Genitalia:    Deferred to GYN  Rectal:    Extremities:   Extremities normal, atraumatic, no cyanosis or edema  Pulses:   2+ and symmetric all extremities  Skin:   Skin color, texture, turgor normal, no rashes or lesions  Lymph nodes:   Cervical, supraclavicular, and axillary nodes normal  Neurologic:   CNII-XII intact, normal strength, sensation and reflexes    throughout          Assessment & Plan:

## 2013-03-10 NOTE — Patient Instructions (Signed)
Follow up in 1 year or as needed We'll notify you of your lab results and make any changes if needed Call with any questions or concerns YOU LOOK GREAT!!!

## 2013-03-10 NOTE — Assessment & Plan Note (Signed)
Chronic problem.  Excellent control.  Asymptomatic.  Check labs.  No anticipated changes. 

## 2013-03-10 NOTE — Assessment & Plan Note (Signed)
Chronic problem.  On Synthroid.  Check labs.  Adjust meds prn

## 2013-03-10 NOTE — Assessment & Plan Note (Signed)
Chronic problem.  Has been controlling w/ healthy diet.  Check labs.  Start meds prn.

## 2013-03-10 NOTE — Assessment & Plan Note (Signed)
Chronic problem.  Pt continues on Actonel.  Will check Vit D.  Pt has no desire for repeat DEXA.

## 2013-03-14 LAB — VITAMIN D 1,25 DIHYDROXY
Vitamin D 1, 25 (OH)2 Total: 46 pg/mL (ref 18–72)
Vitamin D2 1, 25 (OH)2: <8 pg/mL
Vitamin D3 1, 25 (OH)2: 46 pg/mL

## 2013-03-15 ENCOUNTER — Encounter: Payer: Self-pay | Admitting: General Practice

## 2013-04-07 ENCOUNTER — Telehealth: Payer: Self-pay | Admitting: *Deleted

## 2013-04-07 MED ORDER — IBANDRONATE SODIUM 150 MG PO TABS: 150.0000 mg | ORAL_TABLET | ORAL | Status: AC

## 2013-04-07 NOTE — Telephone Encounter (Signed)
04/07/2013  Pt daughter came by office this morning.  She brought by paperwork showing that Medicare has removed ACTONEL from their prescription drug list.   Asking to please prescribe a replacement.  Please advise daughter, Donnamarie Poag [phone].  Thank you.  bw

## 2013-04-07 NOTE — Telephone Encounter (Signed)
Ok for ibandronate (Boniva) 150mg  monthly in place of Actonel

## 2013-04-07 NOTE — Telephone Encounter (Signed)
Per papers insurance will cover alendronate or ibandronate. Please advise which you prefer. Papers placed on your counter .

## 2013-04-07 NOTE — Telephone Encounter (Signed)
Med filled and pt daughter notified. Papers placed in scanning folder.

## 2013-04-27 ENCOUNTER — Telehealth: Payer: Self-pay | Admitting: Family Medicine

## 2013-04-27 NOTE — Telephone Encounter (Signed)
Patient's daughter called and states that her mother had a major stroke on Friday Nov 28th at 9:30am and she will be admitted to [facility] today. She wants to thank Dr. [name] for everything she has done for her mother. No call back requested.

## 2013-04-27 NOTE — Telephone Encounter (Signed)
Fyi.

## 2013-04-28 ENCOUNTER — Other Ambulatory Visit: Payer: Self-pay | Admitting: Family Medicine

## 2013-04-28 NOTE — Telephone Encounter (Signed)
Med filled.  

## 2013-05-27 DEATH — deceased

## 2013-06-11 ENCOUNTER — Telehealth: Payer: Self-pay

## 2013-06-11 NOTE — Telephone Encounter (Signed)
[facility] per Ileene HutchinsonObituary [place]

## 2013-12-25 IMAGING — CR DG ELBOW COMPLETE 3+V*R*
4 series · 4 of 4 positions shown · non-contrast
Comparison: None

CLINICAL DATA: Fall with right elbow injury and pain.

RIGHT ELBOW - COMPLETE 3+ VIEW

[view not recorded (1 of 4)]
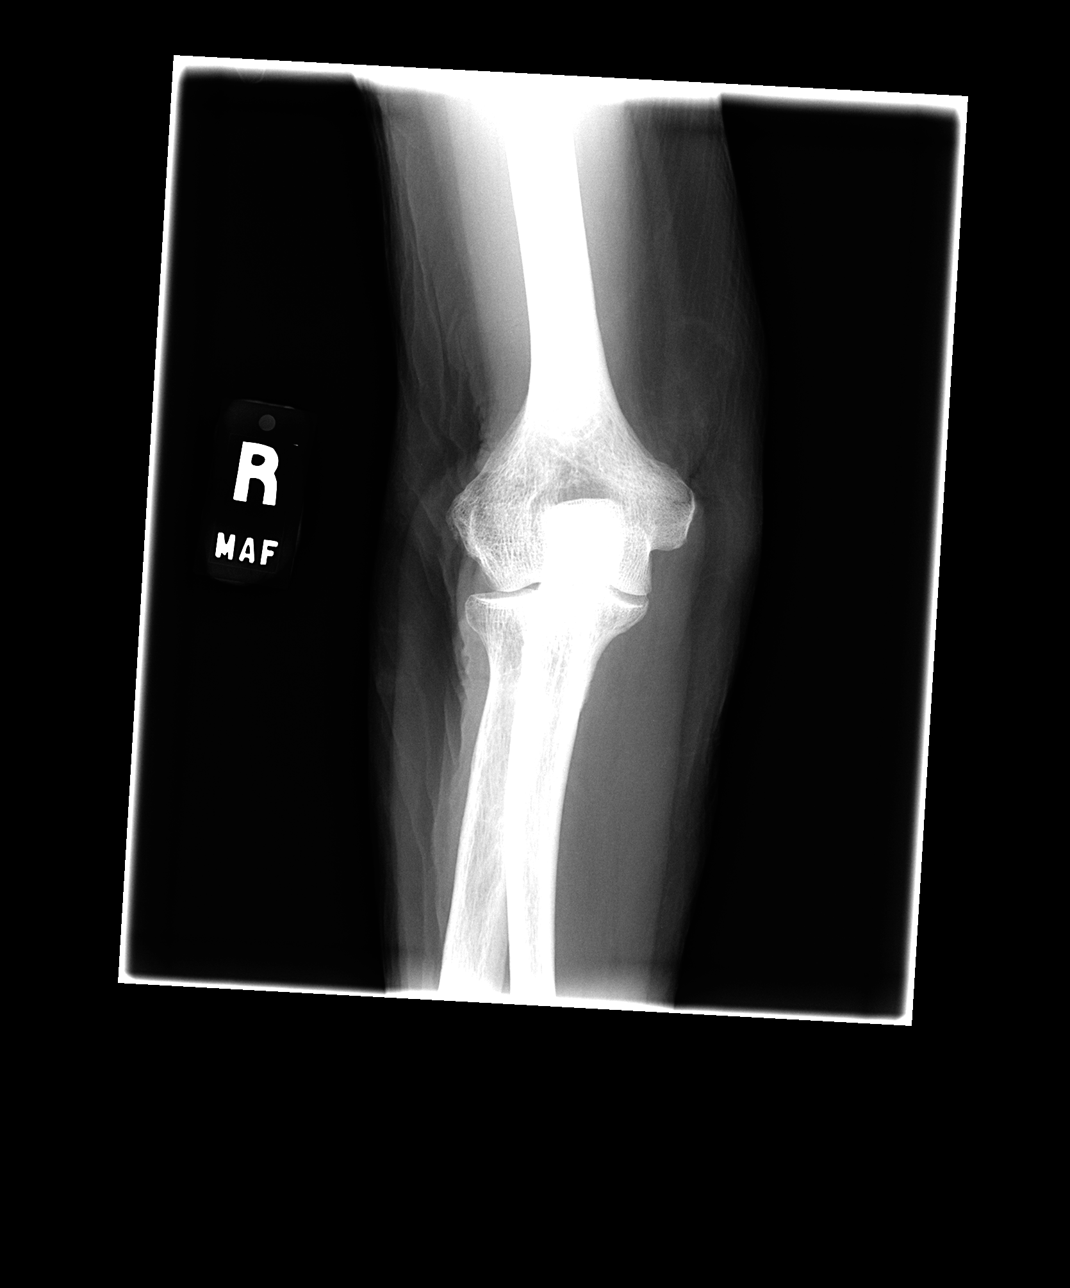

[view not recorded (2 of 4)]
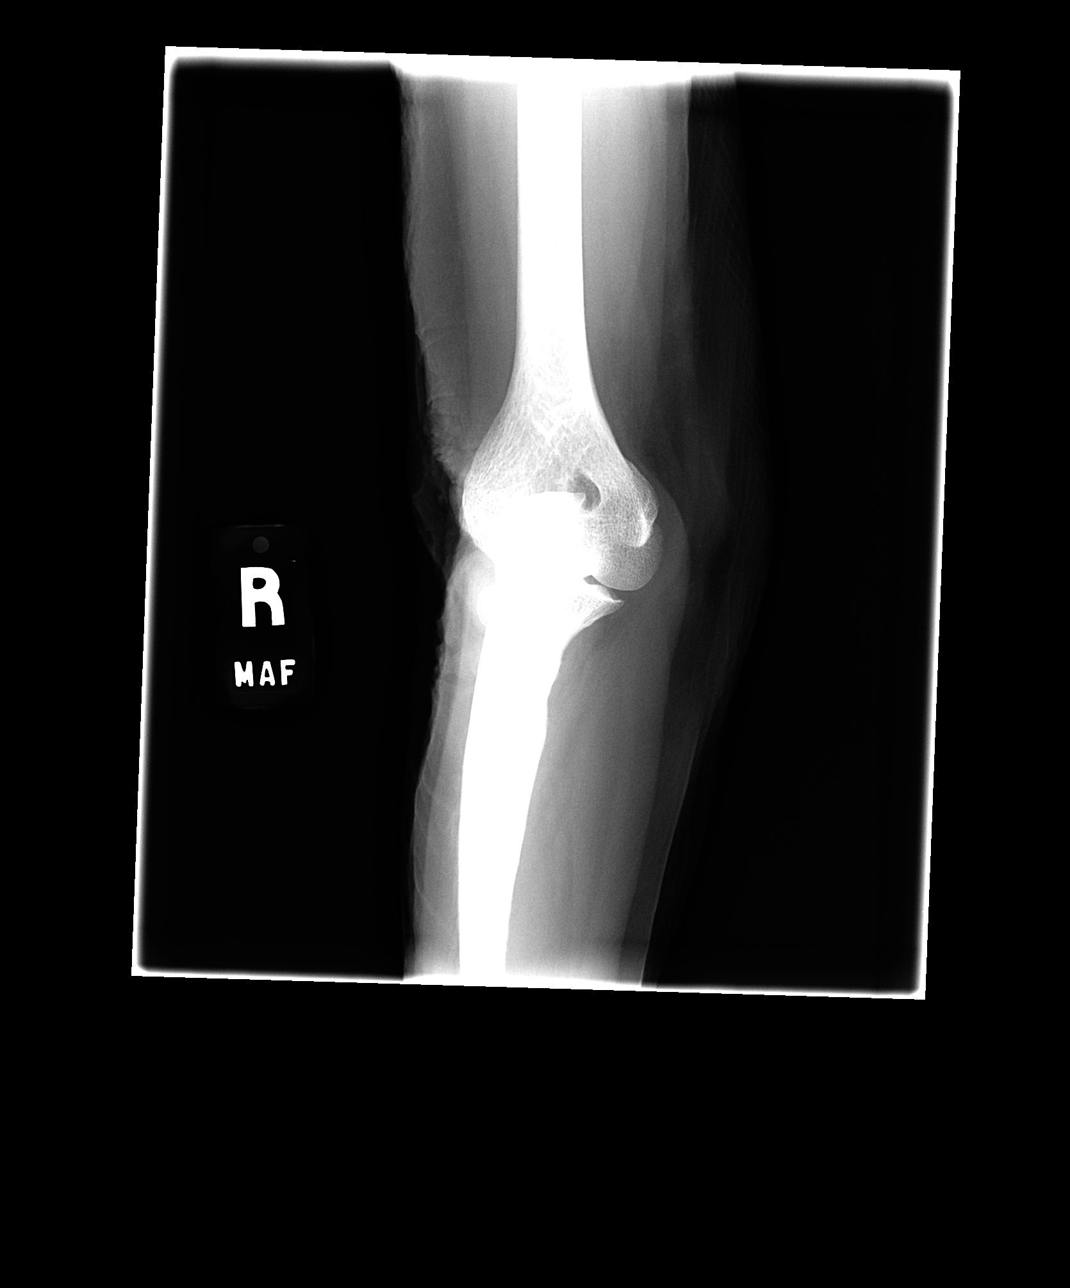

[view not recorded (3 of 4)]
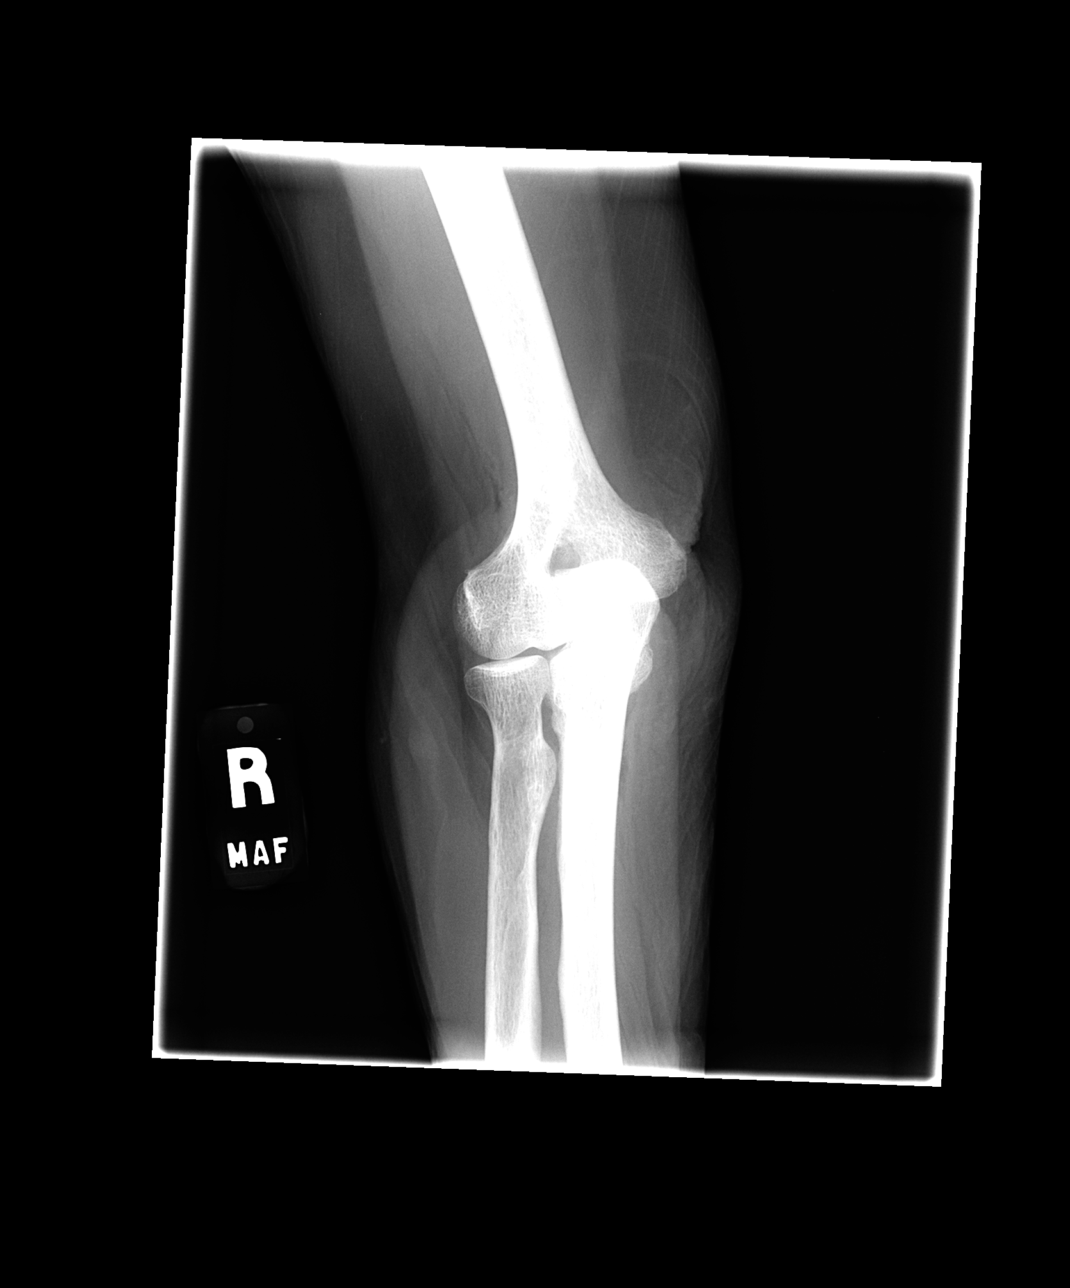

[view not recorded (4 of 4)]
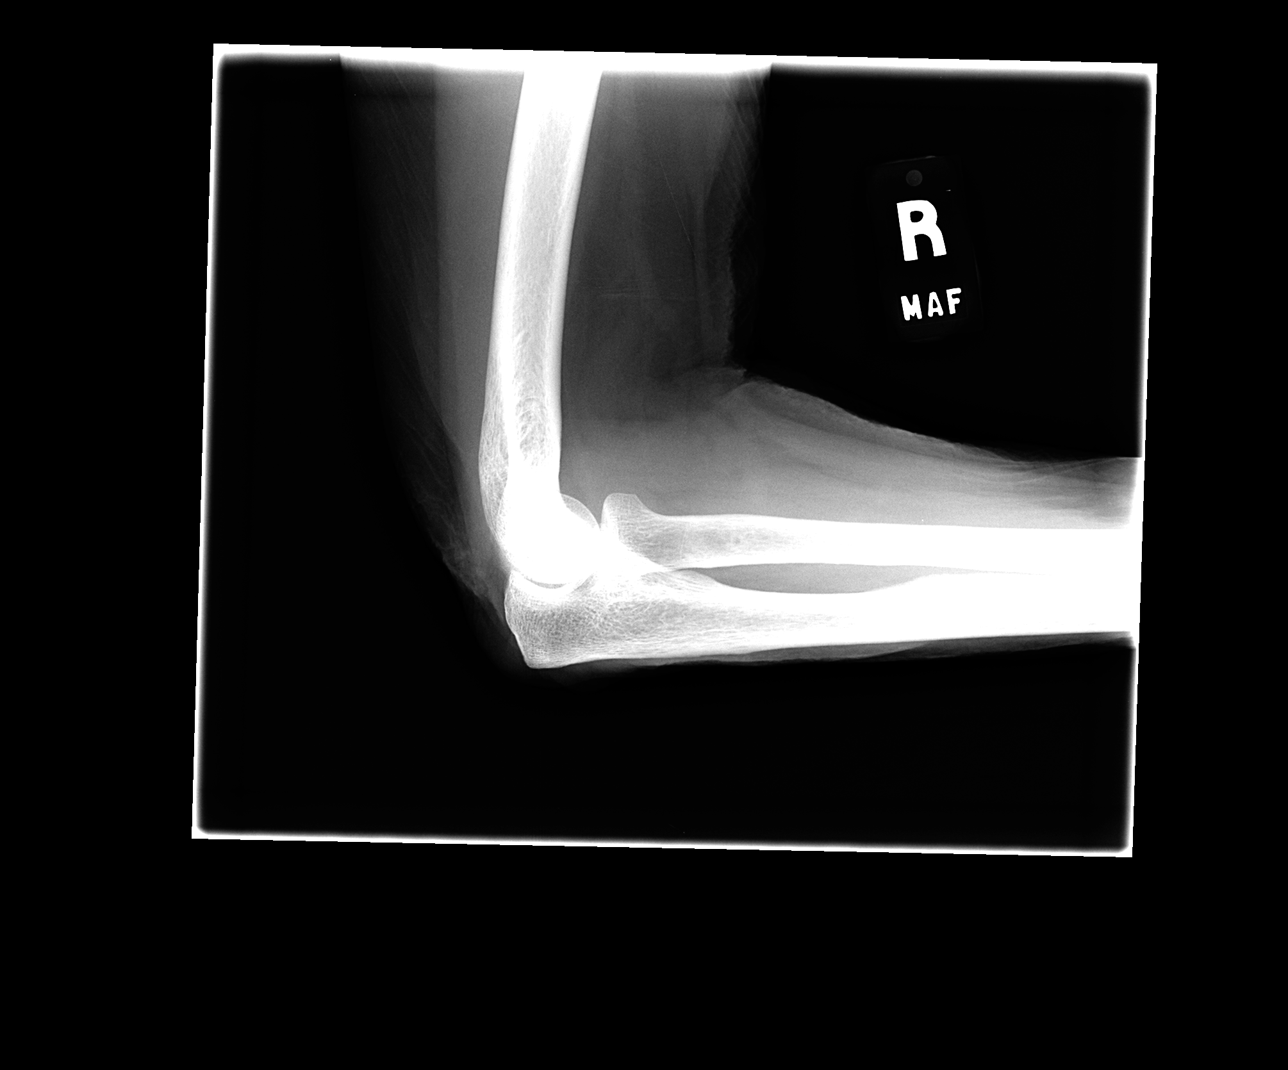

[4 of 4 positions shown; findings below may reference images not displayed]

FINDINGS: No evidence of acute fracture, subluxation or dislocation
identified.

No joint effusion noted.

No radio-opaque foreign bodies are present.

No focal bony lesions are noted.

The joint spaces are unremarkable.
IMPRESSION: No evidence of acute abnormality.

## 2014-03-28 ENCOUNTER — Encounter: Payer: Self-pay | Admitting: Family Medicine
